# Patient Record
Sex: Female | Born: 1955 | Race: White | Hispanic: No | Smoking: Never smoker
Health system: Southern US, Community
[De-identification: ages and names within clinical notes are randomized; demographics above are authoritative.]

## PROBLEM LIST (undated history)

## (undated) DIAGNOSIS — K3184 Gastroparesis: Secondary | ICD-10-CM

## (undated) DIAGNOSIS — E119 Type 2 diabetes mellitus without complications: Secondary | ICD-10-CM

## (undated) DIAGNOSIS — F411 Generalized anxiety disorder: Secondary | ICD-10-CM

## (undated) DIAGNOSIS — Z780 Asymptomatic menopausal state: Secondary | ICD-10-CM

## (undated) DIAGNOSIS — E0590 Thyrotoxicosis, unspecified without thyrotoxic crisis or storm: Principal | ICD-10-CM

## (undated) DIAGNOSIS — IMO0001 Reserved for inherently not codable concepts without codable children: Secondary | ICD-10-CM

## (undated) DIAGNOSIS — E785 Hyperlipidemia, unspecified: Secondary | ICD-10-CM

## (undated) DIAGNOSIS — E1129 Type 2 diabetes mellitus with other diabetic kidney complication: Secondary | ICD-10-CM

## (undated) DIAGNOSIS — M810 Age-related osteoporosis without current pathological fracture: Secondary | ICD-10-CM

## (undated) DIAGNOSIS — J349 Unspecified disorder of nose and nasal sinuses: Secondary | ICD-10-CM

## (undated) DIAGNOSIS — I10 Essential (primary) hypertension: Secondary | ICD-10-CM

## (undated) DIAGNOSIS — K219 Gastro-esophageal reflux disease without esophagitis: Secondary | ICD-10-CM

## (undated) DIAGNOSIS — D510 Vitamin B12 deficiency anemia due to intrinsic factor deficiency: Secondary | ICD-10-CM

## (undated) HISTORY — PX: GROWTH PLATE SURGERY: SHX657

## (undated) HISTORY — DX: Age-related osteoporosis without current pathological fracture: M81.0

## (undated) HISTORY — DX: Generalized anxiety disorder: F41.1

## (undated) HISTORY — DX: Vitamin B12 deficiency anemia due to intrinsic factor deficiency: D51.0

## (undated) HISTORY — PX: TUBAL LIGATION: SHX77

## (undated) HISTORY — DX: Gastro-esophageal reflux disease without esophagitis: K21.9

## (undated) HISTORY — DX: Asymptomatic menopausal state: Z78.0

## (undated) HISTORY — DX: Essential (primary) hypertension: I10

## (undated) HISTORY — DX: Type 2 diabetes mellitus with other diabetic kidney complication: E11.29

## (undated) HISTORY — DX: Reserved for inherently not codable concepts without codable children: IMO0001

## (undated) HISTORY — DX: Unspecified disorder of nose and nasal sinuses: J34.9

## (undated) HISTORY — DX: Type 2 diabetes mellitus without complications: E11.9

## (undated) HISTORY — DX: Gastroparesis: K31.84

## (undated) HISTORY — DX: Thyrotoxicosis, unspecified without thyrotoxic crisis or storm: E05.90

## (undated) HISTORY — PX: CHOLECYSTECTOMY: SHX55

## (undated) HISTORY — DX: Hyperlipidemia, unspecified: E78.5

---

## 1998-06-28 ENCOUNTER — Encounter: Admission: RE | Admit: 1998-06-28 | Discharge: 1998-09-26 | Payer: Self-pay | Admitting: Endocrinology

## 1998-08-24 ENCOUNTER — Encounter: Payer: Self-pay | Admitting: Endocrinology

## 1998-08-24 ENCOUNTER — Ambulatory Visit (HOSPITAL_COMMUNITY): Admission: RE | Admit: 1998-08-24 | Discharge: 1998-08-24 | Payer: Self-pay | Admitting: Endocrinology

## 2004-01-13 ENCOUNTER — Ambulatory Visit: Payer: Self-pay | Admitting: Endocrinology

## 2004-02-18 ENCOUNTER — Ambulatory Visit: Payer: Self-pay | Admitting: Endocrinology

## 2004-12-09 ENCOUNTER — Ambulatory Visit: Payer: Self-pay | Admitting: Endocrinology

## 2004-12-16 ENCOUNTER — Ambulatory Visit: Payer: Self-pay | Admitting: Endocrinology

## 2005-01-13 ENCOUNTER — Ambulatory Visit: Payer: Self-pay | Admitting: Endocrinology

## 2005-02-13 ENCOUNTER — Ambulatory Visit: Payer: Self-pay | Admitting: Endocrinology

## 2005-03-17 ENCOUNTER — Ambulatory Visit: Payer: Self-pay | Admitting: Endocrinology

## 2005-04-17 ENCOUNTER — Ambulatory Visit: Payer: Self-pay | Admitting: Endocrinology

## 2005-05-15 ENCOUNTER — Ambulatory Visit: Payer: Self-pay | Admitting: Endocrinology

## 2005-06-09 ENCOUNTER — Ambulatory Visit: Payer: Self-pay | Admitting: Gastroenterology

## 2005-06-15 ENCOUNTER — Ambulatory Visit: Payer: Self-pay | Admitting: Endocrinology

## 2005-07-17 ENCOUNTER — Ambulatory Visit: Payer: Self-pay | Admitting: Endocrinology

## 2005-08-17 ENCOUNTER — Ambulatory Visit: Payer: Self-pay | Admitting: Endocrinology

## 2005-09-02 ENCOUNTER — Emergency Department (HOSPITAL_COMMUNITY): Admission: EM | Admit: 2005-09-02 | Discharge: 2005-09-02 | Payer: Self-pay | Admitting: Emergency Medicine

## 2005-09-11 ENCOUNTER — Ambulatory Visit: Payer: Self-pay | Admitting: Endocrinology

## 2005-10-13 ENCOUNTER — Ambulatory Visit: Payer: Self-pay | Admitting: Endocrinology

## 2005-11-10 ENCOUNTER — Ambulatory Visit: Payer: Self-pay | Admitting: Endocrinology

## 2005-12-07 ENCOUNTER — Ambulatory Visit: Payer: Self-pay | Admitting: Endocrinology

## 2005-12-22 ENCOUNTER — Ambulatory Visit: Payer: Self-pay | Admitting: Endocrinology

## 2005-12-22 LAB — CONVERTED CEMR LAB
ALT: 20 units/L (ref 0–40)
AST: 23 units/L (ref 0–37)
Albumin: 3.8 g/dL (ref 3.5–5.2)
Alkaline Phosphatase: 64 units/L (ref 39–117)
BUN: 17 mg/dL (ref 6–23)
Basophils Absolute: 0 10*3/uL (ref 0.0–0.1)
Basophils Relative: 0.5 % (ref 0.0–1.0)
Bilirubin Urine: NEGATIVE
CO2: 26 meq/L (ref 19–32)
Calcium: 9.5 mg/dL (ref 8.4–10.5)
Chloride: 101 meq/L (ref 96–112)
Chol/HDL Ratio, serum: 2.9
Cholesterol: 123 mg/dL (ref 0–200)
Creatinine, Ser: 0.9 mg/dL (ref 0.4–1.2)
Creatinine,U: 157.1 mg/dL
Crystals: NEGATIVE
Eosinophil percent: 2 % (ref 0.0–5.0)
GFR calc non Af Amer: 70 mL/min
Glomerular Filtration Rate, Af Am: 85 mL/min/{1.73_m2}
Glucose, Bld: 171 mg/dL — ABNORMAL HIGH (ref 70–99)
HCT: 38.1 % (ref 36.0–46.0)
HDL: 42.2 mg/dL (ref >39.0)
Hemoglobin, Urine: NEGATIVE
Hemoglobin: 12.7 g/dL (ref 12.0–15.0)
Hgb A1c MFr Bld: 6.5 % — ABNORMAL HIGH (ref 4.6–6.0)
Ketones, ur: NEGATIVE mg/dL
LDL Cholesterol: 65 mg/dL (ref 0–99)
Lymphocytes Relative: 28 % (ref 12.0–46.0)
MCHC: 33.2 g/dL (ref 30.0–36.0)
MCV: 88.2 fL (ref 78.0–100.0)
Microalb Creat Ratio: 2.5 mg/g (ref 0.0–30.0)
Microalb, Ur: 0.4 mg/dL (ref 0.0–1.9)
Monocytes Absolute: 0.5 10*3/uL (ref 0.2–0.7)
Monocytes Relative: 6.1 % (ref 3.0–11.0)
Mucus, UA: NEGATIVE
Neutro Abs: 5.5 10*3/uL (ref 1.4–7.7)
Neutrophils Relative %: 63.4 % (ref 43.0–77.0)
Nitrite: NEGATIVE
Platelets: 405 10*3/uL — ABNORMAL HIGH (ref 150–400)
Potassium: 4.6 meq/L (ref 3.5–5.1)
RBC / HPF: NONE SEEN
RBC: 4.32 M/uL (ref 3.87–5.11)
RDW: 12.9 % (ref 11.5–14.6)
Sodium: 136 meq/L (ref 135–145)
Specific Gravity, Urine: 1.02 (ref 1.000–1.03)
TSH: 0.78 microintl units/mL (ref 0.35–5.50)
Total Bilirubin: 0.7 mg/dL (ref 0.3–1.2)
Total Protein, Urine: NEGATIVE mg/dL
Total Protein: 7 g/dL (ref 6.0–8.3)
Triglyceride fasting, serum: 79 mg/dL (ref 0–149)
Urine Glucose: NEGATIVE mg/dL
Urobilinogen, UA: 1 (ref 0.0–1.0)
VLDL: 16 mg/dL (ref 0–40)
WBC: 8.6 10*3/uL (ref 4.5–10.5)
pH: 6 (ref 5.0–8.0)

## 2006-01-12 ENCOUNTER — Ambulatory Visit: Payer: Self-pay | Admitting: Endocrinology

## 2006-01-12 ENCOUNTER — Ambulatory Visit: Payer: Self-pay | Admitting: Internal Medicine

## 2006-02-09 ENCOUNTER — Ambulatory Visit: Payer: Self-pay | Admitting: Endocrinology

## 2006-03-09 ENCOUNTER — Ambulatory Visit: Payer: Self-pay | Admitting: Endocrinology

## 2006-04-06 ENCOUNTER — Ambulatory Visit: Payer: Self-pay | Admitting: Endocrinology

## 2006-04-27 ENCOUNTER — Ambulatory Visit: Payer: Self-pay | Admitting: Endocrinology

## 2006-04-27 LAB — CONVERTED CEMR LAB: Hgb A1c MFr Bld: 6.9 % — ABNORMAL HIGH (ref 4.6–6.0)

## 2006-05-25 ENCOUNTER — Ambulatory Visit: Payer: Self-pay | Admitting: Endocrinology

## 2006-06-22 ENCOUNTER — Ambulatory Visit: Payer: Self-pay | Admitting: Endocrinology

## 2006-07-13 ENCOUNTER — Ambulatory Visit: Payer: Self-pay | Admitting: Endocrinology

## 2006-07-30 ENCOUNTER — Encounter: Payer: Self-pay | Admitting: Endocrinology

## 2006-07-30 DIAGNOSIS — E11359 Type 2 diabetes mellitus with proliferative diabetic retinopathy without macular edema: Secondary | ICD-10-CM

## 2006-07-30 DIAGNOSIS — E1149 Type 2 diabetes mellitus with other diabetic neurological complication: Secondary | ICD-10-CM | POA: Insufficient documentation

## 2006-07-30 DIAGNOSIS — M810 Age-related osteoporosis without current pathological fracture: Secondary | ICD-10-CM | POA: Insufficient documentation

## 2006-07-30 DIAGNOSIS — D510 Vitamin B12 deficiency anemia due to intrinsic factor deficiency: Secondary | ICD-10-CM

## 2006-07-30 DIAGNOSIS — K219 Gastro-esophageal reflux disease without esophagitis: Secondary | ICD-10-CM

## 2006-07-30 DIAGNOSIS — E113599 Type 2 diabetes mellitus with proliferative diabetic retinopathy without macular edema, unspecified eye: Secondary | ICD-10-CM | POA: Insufficient documentation

## 2006-07-30 HISTORY — DX: Age-related osteoporosis without current pathological fracture: M81.0

## 2006-07-30 HISTORY — DX: Vitamin B12 deficiency anemia due to intrinsic factor deficiency: D51.0

## 2006-07-30 HISTORY — DX: Gastro-esophageal reflux disease without esophagitis: K21.9

## 2006-08-10 ENCOUNTER — Ambulatory Visit: Payer: Self-pay | Admitting: Endocrinology

## 2006-09-07 ENCOUNTER — Ambulatory Visit: Payer: Self-pay | Admitting: Endocrinology

## 2006-09-07 LAB — CONVERTED CEMR LAB: Hgb A1c MFr Bld: 7 % — ABNORMAL HIGH (ref 4.6–6.0)

## 2006-10-05 ENCOUNTER — Ambulatory Visit: Payer: Self-pay | Admitting: Endocrinology

## 2007-01-08 ENCOUNTER — Ambulatory Visit: Payer: Self-pay | Admitting: Endocrinology

## 2007-01-08 DIAGNOSIS — G5600 Carpal tunnel syndrome, unspecified upper limb: Secondary | ICD-10-CM

## 2007-01-08 DIAGNOSIS — IMO0001 Reserved for inherently not codable concepts without codable children: Secondary | ICD-10-CM

## 2007-01-08 HISTORY — DX: Reserved for inherently not codable concepts without codable children: IMO0001

## 2007-02-25 ENCOUNTER — Encounter: Payer: Self-pay | Admitting: Endocrinology

## 2007-04-03 ENCOUNTER — Ambulatory Visit: Payer: Self-pay | Admitting: Endocrinology

## 2007-04-03 DIAGNOSIS — M79609 Pain in unspecified limb: Secondary | ICD-10-CM

## 2007-04-10 ENCOUNTER — Encounter: Payer: Self-pay | Admitting: Endocrinology

## 2007-04-10 ENCOUNTER — Ambulatory Visit: Payer: Self-pay

## 2007-04-18 ENCOUNTER — Encounter: Payer: Self-pay | Admitting: Endocrinology

## 2007-07-05 ENCOUNTER — Ambulatory Visit: Payer: Self-pay | Admitting: Endocrinology

## 2007-07-05 DIAGNOSIS — K319 Disease of stomach and duodenum, unspecified: Secondary | ICD-10-CM

## 2007-07-05 DIAGNOSIS — E1129 Type 2 diabetes mellitus with other diabetic kidney complication: Secondary | ICD-10-CM

## 2007-07-05 DIAGNOSIS — E119 Type 2 diabetes mellitus without complications: Secondary | ICD-10-CM

## 2007-07-05 DIAGNOSIS — Z780 Asymptomatic menopausal state: Secondary | ICD-10-CM | POA: Insufficient documentation

## 2007-07-05 DIAGNOSIS — R609 Edema, unspecified: Secondary | ICD-10-CM

## 2007-07-05 HISTORY — DX: Asymptomatic menopausal state: Z78.0

## 2007-07-05 HISTORY — DX: Type 2 diabetes mellitus without complications: E11.9

## 2007-07-05 HISTORY — DX: Type 2 diabetes mellitus with other diabetic kidney complication: E11.29

## 2007-07-05 LAB — CONVERTED CEMR LAB: Hgb A1c MFr Bld: 7.4 % — ABNORMAL HIGH (ref 4.6–6.0)

## 2007-08-01 ENCOUNTER — Telehealth: Payer: Self-pay | Admitting: Gastroenterology

## 2007-08-09 ENCOUNTER — Ambulatory Visit: Payer: Self-pay | Admitting: Endocrinology

## 2007-08-16 DIAGNOSIS — K5730 Diverticulosis of large intestine without perforation or abscess without bleeding: Secondary | ICD-10-CM | POA: Insufficient documentation

## 2007-08-20 ENCOUNTER — Ambulatory Visit: Payer: Self-pay | Admitting: Gastroenterology

## 2007-08-20 DIAGNOSIS — K3184 Gastroparesis: Secondary | ICD-10-CM | POA: Insufficient documentation

## 2007-08-20 HISTORY — DX: Gastroparesis: K31.84

## 2007-09-12 ENCOUNTER — Ambulatory Visit: Payer: Self-pay | Admitting: Endocrinology

## 2007-09-12 DIAGNOSIS — R238 Other skin changes: Secondary | ICD-10-CM

## 2007-09-13 ENCOUNTER — Telehealth (INDEPENDENT_AMBULATORY_CARE_PROVIDER_SITE_OTHER): Payer: Self-pay | Admitting: *Deleted

## 2007-09-13 LAB — CONVERTED CEMR LAB
Basophils Absolute: 0 10*3/uL (ref 0.0–0.1)
Basophils Relative: 0.2 % (ref 0.0–3.0)
Eosinophils Relative: 4.1 % (ref 0.0–5.0)
Lymphocytes Relative: 27.2 % (ref 12.0–46.0)
Neutrophils Relative %: 61.4 % (ref 43.0–77.0)
Prothrombin Time: 12.4 s (ref 10.9–13.3)
RBC: 4.64 M/uL (ref 3.87–5.11)
WBC: 11.7 10*3/uL — ABNORMAL HIGH (ref 4.5–10.5)

## 2007-09-19 ENCOUNTER — Encounter: Payer: Self-pay | Admitting: Endocrinology

## 2007-09-20 ENCOUNTER — Encounter: Payer: Self-pay | Admitting: Endocrinology

## 2007-11-18 ENCOUNTER — Telehealth: Payer: Self-pay | Admitting: Endocrinology

## 2007-11-18 ENCOUNTER — Ambulatory Visit: Payer: Self-pay | Admitting: Endocrinology

## 2007-11-18 DIAGNOSIS — I10 Essential (primary) hypertension: Secondary | ICD-10-CM | POA: Insufficient documentation

## 2007-11-18 HISTORY — DX: Essential (primary) hypertension: I10

## 2007-11-19 ENCOUNTER — Telehealth (INDEPENDENT_AMBULATORY_CARE_PROVIDER_SITE_OTHER): Payer: Self-pay | Admitting: *Deleted

## 2008-02-06 ENCOUNTER — Telehealth: Payer: Self-pay | Admitting: Endocrinology

## 2008-02-28 HISTORY — PX: TRIGGER FINGER RELEASE: SHX641

## 2008-02-28 HISTORY — PX: RETINAL DETACHMENT SURGERY: SHX105

## 2008-02-28 HISTORY — PX: OTHER SURGICAL HISTORY: SHX169

## 2008-02-28 HISTORY — PX: CARPAL TUNNEL RELEASE: SHX101

## 2008-03-13 ENCOUNTER — Telehealth: Payer: Self-pay | Admitting: Endocrinology

## 2008-03-19 ENCOUNTER — Ambulatory Visit: Payer: Self-pay | Admitting: Endocrinology

## 2008-03-20 ENCOUNTER — Telehealth: Payer: Self-pay | Admitting: Endocrinology

## 2008-04-22 ENCOUNTER — Encounter: Payer: Self-pay | Admitting: Endocrinology

## 2008-04-30 ENCOUNTER — Ambulatory Visit: Payer: Self-pay | Admitting: Endocrinology

## 2008-05-11 ENCOUNTER — Telehealth: Payer: Self-pay | Admitting: Endocrinology

## 2008-05-12 ENCOUNTER — Ambulatory Visit: Payer: Self-pay | Admitting: Endocrinology

## 2008-05-12 DIAGNOSIS — E7800 Pure hypercholesterolemia, unspecified: Secondary | ICD-10-CM

## 2008-05-13 ENCOUNTER — Telehealth (INDEPENDENT_AMBULATORY_CARE_PROVIDER_SITE_OTHER): Payer: Self-pay | Admitting: *Deleted

## 2008-05-13 ENCOUNTER — Encounter: Payer: Self-pay | Admitting: Endocrinology

## 2008-05-14 ENCOUNTER — Ambulatory Visit (HOSPITAL_COMMUNITY): Admission: RE | Admit: 2008-05-14 | Discharge: 2008-05-15 | Payer: Self-pay | Admitting: Ophthalmology

## 2008-06-23 ENCOUNTER — Telehealth: Payer: Self-pay | Admitting: Endocrinology

## 2008-07-13 ENCOUNTER — Ambulatory Visit: Payer: Self-pay | Admitting: Endocrinology

## 2008-07-15 LAB — CONVERTED CEMR LAB
Albumin: 4.1 g/dL (ref 3.5–5.2)
Alkaline Phosphatase: 79 units/L (ref 39–117)
Basophils Absolute: 0 10*3/uL (ref 0.0–0.1)
Chloride: 104 meq/L (ref 96–112)
Cholesterol: 130 mg/dL (ref 0–200)
Creatinine, Ser: 0.6 mg/dL (ref 0.4–1.2)
Eosinophils Absolute: 0.4 10*3/uL (ref 0.0–0.7)
Hemoglobin, Urine: NEGATIVE
Hemoglobin: 13.6 g/dL (ref 12.0–15.0)
Hgb A1c MFr Bld: 7.1 % — ABNORMAL HIGH (ref 4.6–6.5)
LDL Cholesterol: 66 mg/dL (ref 0–99)
Lymphocytes Relative: 39.9 % (ref 12.0–46.0)
Lymphs Abs: 3.3 10*3/uL (ref 0.7–4.0)
MCHC: 33.9 g/dL (ref 30.0–36.0)
Neutro Abs: 3.9 10*3/uL (ref 1.4–7.7)
Nitrite: NEGATIVE
Potassium: 4.8 meq/L (ref 3.5–5.1)
RDW: 13 % (ref 11.5–14.6)
Sodium: 141 meq/L (ref 135–145)
TSH: 0.73 microintl units/mL (ref 0.35–5.50)
Triglycerides: 76 mg/dL (ref 0.0–149.0)
Urobilinogen, UA: 1 (ref 0.0–1.0)

## 2008-07-20 ENCOUNTER — Ambulatory Visit: Payer: Self-pay | Admitting: Endocrinology

## 2008-08-20 ENCOUNTER — Telehealth: Payer: Self-pay | Admitting: Endocrinology

## 2008-11-10 ENCOUNTER — Ambulatory Visit: Payer: Self-pay | Admitting: Endocrinology

## 2009-01-19 ENCOUNTER — Telehealth: Payer: Self-pay | Admitting: Endocrinology

## 2009-01-26 ENCOUNTER — Telehealth: Payer: Self-pay | Admitting: Endocrinology

## 2009-02-09 ENCOUNTER — Ambulatory Visit: Payer: Self-pay | Admitting: Endocrinology

## 2009-04-26 ENCOUNTER — Telehealth: Payer: Self-pay | Admitting: Endocrinology

## 2009-05-06 ENCOUNTER — Ambulatory Visit: Payer: Self-pay | Admitting: Endocrinology

## 2009-07-01 ENCOUNTER — Telehealth: Payer: Self-pay | Admitting: Endocrinology

## 2009-08-17 ENCOUNTER — Telehealth: Payer: Self-pay | Admitting: Endocrinology

## 2009-08-17 ENCOUNTER — Ambulatory Visit: Payer: Self-pay | Admitting: Endocrinology

## 2009-08-17 LAB — CONVERTED CEMR LAB: Hgb A1c MFr Bld: 7.5 % — ABNORMAL HIGH (ref 4.6–6.5)

## 2009-08-25 ENCOUNTER — Telehealth: Payer: Self-pay | Admitting: Endocrinology

## 2009-08-31 ENCOUNTER — Telehealth: Payer: Self-pay | Admitting: Endocrinology

## 2009-09-08 ENCOUNTER — Telehealth: Payer: Self-pay | Admitting: Endocrinology

## 2009-09-16 ENCOUNTER — Telehealth: Payer: Self-pay | Admitting: Endocrinology

## 2009-11-10 ENCOUNTER — Ambulatory Visit: Payer: Self-pay | Admitting: Endocrinology

## 2009-11-10 LAB — CONVERTED CEMR LAB
ALT: 30 units/L (ref 0–35)
AST: 27 units/L (ref 0–37)
Alkaline Phosphatase: 74 units/L (ref 39–117)
BUN: 16 mg/dL (ref 6–23)
Bilirubin Urine: NEGATIVE
Bilirubin, Direct: 0.1 mg/dL (ref 0.0–0.3)
Calcium: 10.1 mg/dL (ref 8.4–10.5)
Eosinophils Relative: 5.4 % — ABNORMAL HIGH (ref 0.0–5.0)
GFR calc non Af Amer: 104.1 mL/min (ref >60)
HCT: 38.8 % (ref 36.0–46.0)
Hemoglobin, Urine: NEGATIVE
Hgb A1c MFr Bld: 7.8 % — ABNORMAL HIGH (ref 4.6–6.5)
LDL Cholesterol: 65 mg/dL (ref 0–99)
Lymphocytes Relative: 38.3 % (ref 12.0–46.0)
Lymphs Abs: 3.1 10*3/uL (ref 0.7–4.0)
Microalb, Ur: 0.8 mg/dL (ref 0.0–1.9)
Monocytes Relative: 7 % (ref 3.0–12.0)
Neutrophils Relative %: 48.7 % (ref 43.0–77.0)
Platelets: 344 10*3/uL (ref 150.0–400.0)
Potassium: 5.4 meq/L — ABNORMAL HIGH (ref 3.5–5.1)
Total Bilirubin: 0.4 mg/dL (ref 0.3–1.2)
Total CHOL/HDL Ratio: 3
Total Protein, Urine: NEGATIVE mg/dL
Urine Glucose: NEGATIVE mg/dL
VLDL: 10.4 mg/dL (ref 0.0–40.0)
WBC: 8 10*3/uL (ref 4.5–10.5)
pH: 6.5 (ref 5.0–8.0)

## 2009-11-11 ENCOUNTER — Telehealth: Payer: Self-pay | Admitting: Endocrinology

## 2009-11-16 ENCOUNTER — Encounter: Payer: Self-pay | Admitting: Endocrinology

## 2009-11-16 ENCOUNTER — Ambulatory Visit: Payer: Self-pay | Admitting: Endocrinology

## 2009-11-16 DIAGNOSIS — E0590 Thyrotoxicosis, unspecified without thyrotoxic crisis or storm: Secondary | ICD-10-CM | POA: Insufficient documentation

## 2009-11-16 HISTORY — DX: Thyrotoxicosis, unspecified without thyrotoxic crisis or storm: E05.90

## 2009-11-29 ENCOUNTER — Encounter (HOSPITAL_COMMUNITY)
Admission: RE | Admit: 2009-11-29 | Discharge: 2010-02-27 | Payer: Self-pay | Source: Home / Self Care | Attending: Endocrinology | Admitting: Endocrinology

## 2009-12-02 ENCOUNTER — Ambulatory Visit: Payer: Self-pay | Admitting: Endocrinology

## 2009-12-08 ENCOUNTER — Telehealth: Payer: Self-pay | Admitting: Endocrinology

## 2009-12-08 LAB — CONVERTED CEMR LAB
Free T4: 0.87 ng/dL (ref 0.60–1.60)
TSH: 0.03 microintl units/mL — ABNORMAL LOW (ref 0.35–5.50)

## 2009-12-24 ENCOUNTER — Ambulatory Visit: Payer: Self-pay | Admitting: Endocrinology

## 2009-12-24 LAB — CONVERTED CEMR LAB: TSH: 0.03 microintl units/mL — ABNORMAL LOW (ref 0.35–5.50)

## 2009-12-28 ENCOUNTER — Telehealth: Payer: Self-pay | Admitting: Endocrinology

## 2010-01-04 ENCOUNTER — Telehealth: Payer: Self-pay | Admitting: Endocrinology

## 2010-01-14 ENCOUNTER — Ambulatory Visit (HOSPITAL_COMMUNITY): Admission: RE | Admit: 2010-01-14 | Discharge: 2010-01-14 | Payer: Self-pay | Admitting: Endocrinology

## 2010-02-04 ENCOUNTER — Telehealth: Payer: Self-pay | Admitting: Endocrinology

## 2010-02-08 ENCOUNTER — Ambulatory Visit: Payer: Self-pay | Admitting: Endocrinology

## 2010-02-08 DIAGNOSIS — F411 Generalized anxiety disorder: Secondary | ICD-10-CM | POA: Insufficient documentation

## 2010-02-08 HISTORY — DX: Generalized anxiety disorder: F41.1

## 2010-02-08 LAB — CONVERTED CEMR LAB
Free T4: 0.95 ng/dL (ref 0.60–1.60)
Hgb A1c MFr Bld: 7.8 % — ABNORMAL HIGH (ref 4.6–6.5)

## 2010-03-10 ENCOUNTER — Ambulatory Visit
Admission: RE | Admit: 2010-03-10 | Discharge: 2010-03-10 | Payer: Self-pay | Source: Home / Self Care | Attending: Endocrinology | Admitting: Endocrinology

## 2010-03-10 ENCOUNTER — Other Ambulatory Visit: Payer: Self-pay | Admitting: Endocrinology

## 2010-03-10 LAB — T4, FREE: Free T4: 0.91 ng/dL (ref 0.60–1.60)

## 2010-03-10 LAB — TSH: TSH: 0.02 u[IU]/mL — ABNORMAL LOW (ref 0.35–5.50)

## 2010-03-27 LAB — CONVERTED CEMR LAB
ALT: 22 units/L (ref 0–35)
ALT: 24 units/L (ref 0–35)
AST: 28 units/L (ref 0–37)
AST: 30 units/L (ref 0–37)
Albumin: 4.1 g/dL (ref 3.5–5.2)
Alkaline Phosphatase: 82 units/L (ref 39–117)
Basophils Absolute: 0.1 10*3/uL (ref 0.0–0.1)
Basophils Relative: 0.8 % (ref 0.0–3.0)
Bilirubin Urine: NEGATIVE
Bilirubin, Direct: 0.3 mg/dL (ref 0.0–0.3)
CO2: 25 meq/L (ref 19–32)
CO2: 31 meq/L (ref 19–32)
Calcium: 10.2 mg/dL (ref 8.4–10.5)
Chloride: 102 meq/L (ref 96–112)
Chloride: 96 meq/L (ref 96–112)
Cholesterol: 141 mg/dL (ref 0–200)
Creatinine,U: 44.1 mg/dL
Eosinophils Absolute: 0.3 10*3/uL (ref 0.0–0.7)
Eosinophils Absolute: 0.4 10*3/uL (ref 0.0–0.6)
Eosinophils Relative: 3.9 % (ref 0.0–5.0)
GFR calc Af Amer: 97 mL/min
Glucose, Bld: 126 mg/dL — ABNORMAL HIGH (ref 70–99)
HCT: 37.2 % (ref 36.0–46.0)
Hemoglobin, Urine: NEGATIVE
Hemoglobin, Urine: NEGATIVE
Hemoglobin: 12.9 g/dL (ref 12.0–15.0)
Ketones, ur: NEGATIVE mg/dL
Ketones, ur: NEGATIVE mg/dL
LDL Cholesterol: 73 mg/dL (ref 0–99)
Lymphocytes Relative: 32.9 % (ref 12.0–46.0)
Lymphocytes Relative: 35.3 % (ref 12.0–46.0)
MCHC: 33.8 g/dL (ref 30.0–36.0)
MCV: 86.1 fL (ref 78.0–100.0)
Microalb Creat Ratio: 4.5 mg/g (ref 0.0–30.0)
Monocytes Absolute: 0.6 10*3/uL (ref 0.2–0.7)
Monocytes Relative: 7.6 % (ref 3.0–12.0)
Mucus, UA: NEGATIVE
Neutro Abs: 5.3 10*3/uL (ref 1.4–7.7)
Neutrophils Relative %: 54.4 % (ref 43.0–77.0)
Neutrophils Relative %: 55.6 % (ref 43.0–77.0)
Potassium: 4.3 meq/L (ref 3.5–5.1)
RBC: 4.32 M/uL (ref 3.87–5.11)
RBC: 4.99 M/uL (ref 3.87–5.11)
RDW: 13.3 % (ref 11.5–14.6)
TSH: 1.38 microintl units/mL (ref 0.35–5.50)
Total Bilirubin: 0.6 mg/dL (ref 0.3–1.2)
Total Bilirubin: 0.9 mg/dL (ref 0.3–1.2)
Total Protein, Urine: NEGATIVE mg/dL
Total Protein, Urine: NEGATIVE mg/dL
Total Protein: 7 g/dL (ref 6.0–8.3)
Total Protein: 7.5 g/dL (ref 6.0–8.3)
Triglycerides: 76 mg/dL (ref 0.0–149.0)
Urine Glucose: NEGATIVE mg/dL
VLDL: 15.2 mg/dL (ref 0.0–40.0)
WBC: 9.7 10*3/uL (ref 4.5–10.5)

## 2010-03-31 NOTE — Progress Notes (Signed)
Summary: Rx refill req  Phone Note Refill Request Message from:  Fax from Pharmacy on January 04, 2010 4:36 PM  Refills Requested: Medication #1:  AMITIZA 24 MCG CAPS 1 bid   Dosage confirmed as above?Dosage Confirmed   Last Refilled: 11/22/2009  Method Requested: Electronic Next Appointment Scheduled: 02/08/2010 Initial call taken by: Brenton Grills CMA Duncan Dull),  January 04, 2010 4:37 PM    Prescriptions: AMITIZA 24 MCG CAPS (LUBIPROSTONE) 1 bid  #60 x 3   Entered by:   Brenton Grills CMA (AAMA)   Authorized by:   Minus Breeding MD   Signed by:   Brenton Grills CMA (AAMA) on 01/04/2010   Method used:   Electronically to        [facility] (retail)       [address]       [place], [place]  [address]       Ph: [phone]       Fax: [phone]   RxID:   [id number]

## 2010-03-31 NOTE — Assessment & Plan Note (Signed)
Summary: physical/#/cd   Vital Signs:  Patient profile:   55 year old female Height:      64 inches (162.56 cm) Weight:      185 pounds (84.09 kg) BMI:     31.87 O2 Sat:      98 % on Room air Temp:     98.2 degrees F (36.78 degrees C) oral Pulse rate:   70 / minute BP sitting:   108 / 70  (left arm) Cuff size:   regular  Vitals Entered By: Brenton Grills MA (November 16, 2009 8:39 AM)  O2 Flow:  Room air CC: Physical/pt is no longer using Wrists Splints and is no longer taking Azithromycin/aj Is Patient Diabetic? Yes   Primary Provider:  Dr Romero Belling  CC:  Physical/pt is no longer using Wrists Splints and is no longer taking Azithromycin/aj.  History of Present Illness: here for regular wellness examination.  He's feeling pretty well in general, and does not drink or smoke.   Current Medications (verified): 1)  Pamelor 75 Mg  Caps (Nortriptyline Hcl) .... Take 1 By Mouth Qd 2)  Glucophage 1000 Mg  Tabs (Metformin Hcl) .... Take 1 By Mouth Two Times A Day Qd 3)  Humalog 100 Unit/ml  Soln (Insulin Lispro (Human)) .... Tid (Qac) 0-5-35 Units 4)  Calcium 600/vitamin D 600-200 Mg-Unit  Tabs (Calcium Carbonate-Vitamin D) .... Take 2 Tablets By Mouth Once Daily 5)  Vytorin 10-80 Mg  Tabs (Ezetimibe-Simvastatin) .... Take 1 By Mouth Qd 6)  Bilateral Wrist Splints For Carpal Tunnel Syndrome .... Wear As Much As Possible. One Pair 7)  Glucose Test Strips and Lancets, Any Brand .... Two Times A Day 250.00 8)  Aspirin 81 Mg  Tbec (Aspirin) .... Take 1 Tablet By Mouth Once A Day 9)  Humulin N Pen 100 Unit/ml  Susp (Insulin Isophane Human) .Marland Kitchen.. 15 Units Qhs 10)  Cyanocobalamin 1000 Mcg/ml Soln (Cyanocobalamin) .... Inject 1ml Intramuscular Once A Month 11)  Prilosec Otc 20 Mg Tbec (Omeprazole Magnesium) .... 2 Qd 12)  Screening Mammography 13)  Amitiza 24 Mcg Caps (Lubiprostone) .Marland Kitchen.. 1 Bid 14)  Losartan Potassium-Hctz 50-12.5 Mg Tabs (Losartan Potassium-Hctz) .Marland Kitchen.. 1 Once Daily 15)   Azithromycin 500 Mg Tabs (Azithromycin) .Marland Kitchen.. 1 Once Daily  Allergies (verified): No Known Drug Allergies  Family History: Reviewed history from 08/20/2007 and no changes required. no cancer in her immediate family No FH of Colon Cancer Family History of Diabetes: Mother Family History of Heart Disease: Mother, Father  Social History: Reviewed history from 08/20/2007 and no changes required. unemployed.  She is married. Patient has never smoked.  Alcohol Use - no Illicit Drug Use - no Patient does not get regular exercise.   Review of Systems       The patient complains of weight gain.  The patient denies fever, vision loss, decreased hearing, chest pain, syncope, dyspnea on exertion, prolonged cough, headaches, abdominal pain, melena, hematochezia, severe indigestion/heartburn, hematuria, and suspicious skin lesions.         she has depression  Physical Exam  General:  obese.   Head:  head: no deformity eyes: no periorbital swelling, no proptosis external nose and ears are normal mouth: no lesion seen Breasts:  sees gyn  Heart:  Regular rate and rhythm without murmurs or gallops noted. Normal S1,S2.   Abdomen:  abdomen is soft, nontender.  no hepatosplenomegaly.   not distended.  no hernia  Rectal:  sees gyn  Genitalia:  sees gyn  Msk:  muscle bulk and strength are grossly normal.  no obvious joint swelling.  gait is normal and steady  Extremities:  no deformity.  no ulcer on the feet.  feet are of normal color and temp.  no edema there are a few bilat varicosities. Neurologic:  cn 2-12 grossly intact.   readily moves all 4's.   sensation is intact to touch on the feet  Skin:  normal texture and temp.  no rash.  not diaphoretic  Cervical Nodes:  No significant adenopathy.  Psych:  Alert and cooperative; normal mood and affect; normal attention span and concentration.   Additional Exam:  SEPARATE EVALUATION FOLLOWS--EACH PROBLEM HERE IS NEW, NOT RESPONDING TO  TREATMENT, OR POSES SIGNIFICANT RISK TO THE PATIENT'S HEALTH: HISTORY OF THE PRESENT ILLNESS: pt states 1 year of a slight need to move her legs at night, and assoc "tossing and turning."  pt says she sleeps well. no cbg record, but states cbg's are still highest in am (higher than at hs). PAST MEDICAL HISTORY reviewed and up to date today REVIEW OF SYSTEMS: denies hypoglycemia.  she has anxiety PHYSICAL EXAMINATION: dorsalis pedis intact bilat.  no carotid bruit clear to auscultation.  no respiratory distress neck:  i do not appreciate a goiter LAB/XRAY RESULTS: tsh=0.02 Hemoglobin A1C       [H]  7.8 %      IMPRESSION: dm, needs increased rx restless legs, ? due to hyperthyroidism htn, overcontrolled PLAN: see instruction sheet   Impression & Recommendations:  Problem # 1:  ROUTINE GENERAL MEDICAL EXAM@HEALTH  CARE FACL (ICD-V70.0)  Medications Added to Medication List This Visit: 1)  Humulin N Pen 100 Unit/ml Susp (Insulin isophane human) .... 20 units at bedtime 2)  Losartan Potassium-hctz 50-12.5 Mg Tabs (Losartan potassium-hctz) .... 1/2 tab once daily 3)  Buspirone Hcl 5 Mg Tabs (Buspirone hcl) .Marland Kitchen.. 1 tab two times a day  Other Orders: EKG w/ Interpretation (93000) Radiology Referral (Radiology) Est. Patient Level IV (04540) Est. Patient 40-64 years (98119)  Patient Instructions: 1)  reduce losartan-hct to 1/2 pill once daily 2)  increase nph to 20 units at bedtime. 3)  take buspirone 5 mg two times a day.  call if this does not help, so i can increase it. 4)  please see a gynecologist:  [facility], [facility], [facility], [facility] are examples of good groups. 5)  check thyroid "scan" (a special, but easy and painless thype of thyroid x ray).  you will be called with a day and time for an appointment.  please call [phone] to hear your test results. 6)  please consider these measures for your health:  minimize alcohol.  do not use  tobacco products.  have a colonoscopy at least every 10 years from age 4.  keep firearms safely stored.  always use seat belts.  have working smoke alarms in your home.  see an eye doctor and dentist regularly.  never drive under the influence of alcohol or drugs (including prescription drugs).  those with fair skin should take precautions against the sun. 7)  please let me know what your wishes would be, if artificial life support measures should become necessary.  it is critically important to prevent falling down (keep floor areas well-lit, dry, and free of loose objects) 8)  Please schedule a follow-up appointment in 3 months. Prescriptions: BUSPIRONE HCL 5 MG TABS (BUSPIRONE HCL) 1 tab two times a day  #60 x 11   Entered and Authorized by:  Minus Breeding MD   Signed by:   Minus Breeding MD on 11/16/2009   Method used:   Electronically to        [facility] (retail)       [address]       [place], [place]  [address]       Ph: [phone]       Fax: [phone]   RxID:   [id number]

## 2010-03-31 NOTE — Assessment & Plan Note (Signed)
Summary: 3 MO ROV /NWS   Vital Signs:  Patient profile:   55 year old female Height:      64 inches Weight:      180.38 pounds BMI:     31.07 O2 Sat:      97 % on Room air Temp:     98.2 degrees F oral Pulse rate:   75 / minute BP sitting:   100 / 62  (left arm) Cuff size:   regular  Vitals Entered ByZella Ball Ewing (August 17, 2009 8:03 AM)  O2 Flow:  Room air CC: Endocrinology Consult/RE   Primary Provider:  Dr Romero Belling  CC:  Endocrinology Consult/RE.  History of Present Illness: pt states 4 weeks of dry-quality cough, which is not painful in the chest.  no assoc sob. no cbg record, but states cbg's are well-controlled.  it is still highest in am.  Current Medications (verified): 1)  Pamelor 75 Mg  Caps (Nortriptyline Hcl) .... Take 1 By Mouth Qd 2)  Glucophage 1000 Mg  Tabs (Metformin Hcl) .... Take 1 By Mouth Two Times A Day Qd 3)  Humalog 100 Unit/ml  Soln (Insulin Lispro (Human)) .... Tid (Qac) 0-5-35 Units 4)  Calcium 600/vitamin D 600-200 Mg-Unit  Tabs (Calcium Carbonate-Vitamin D) .... Take 2 Tablets By Mouth Once Daily 5)  Vytorin 10-80 Mg  Tabs (Ezetimibe-Simvastatin) .... Take 1 By Mouth Qd 6)  Bilateral Wrist Splints For Carpal Tunnel Syndrome .... Wear As Much As Possible. One Pair 7)  Glucose Test Strips and Lancets, Any Brand .... Two Times A Day 250.00 8)  Aspirin 81 Mg  Tbec (Aspirin) .... Take 1 Tablet By Mouth Once A Day 9)  Humulin N Pen 100 Unit/ml  Susp (Insulin Isophane Human) .Marland Kitchen.. 10 Units Qhs 10)  Zestoretic 10-12.5 Mg Tabs (Lisinopril-Hydrochlorothiazide) .... Qd 11)  Cyanocobalamin 1000 Mcg/ml Soln (Cyanocobalamin) .... Inject 1ml Intramuscular Once A Month 12)  Prilosec Otc 20 Mg Tbec (Omeprazole Magnesium) .... 2 Qd 13)  Screening Mammography 14)  Amitiza 24 Mcg Caps (Lubiprostone) .Marland Kitchen.. 1 Bid  Allergies (verified): No Known Drug Allergies  Review of Systems  The patient denies fever.         she has slight rhinorrhea  Physical  Exam  General:  normal appearance.   Head:  head: no deformity eyes: no periorbital swelling, no proptosis external nose and ears are normal mouth: no lesion seen Ears:  right tm is slightly red.  left is normal Lungs:  Clear to auscultation bilaterally. Normal respiratory effort.  Additional Exam:  Hemoglobin A1C       [H]  7.5 %    Impression & Recommendations:  Problem # 1:  DM (ICD-250.00) Assessment Improved  Problem # 2:  cough possibly due to acei  Problem # 3:  uri vs allergic rhinitis  Medications Added to Medication List This Visit: 1)  Humalog 100 Unit/ml Soln (Insulin lispro (human)) .... Tid (qac) 0-5-35 units 2)  Humulin N Pen 100 Unit/ml Susp (Insulin isophane human) .Marland Kitchen.. 15 units qhs 3)  Losartan Potassium-hctz 50-12.5 Mg Tabs (Losartan potassium-hctz) .Marland Kitchen.. 1 once daily 4)  Azithromycin 500 Mg Tabs (Azithromycin) .Marland Kitchen.. 1 once daily  Other Orders: TLB-A1C / Hgb A1C (Glycohemoglobin) (83036-A1C) Est. Patient Level IV (14431)  Patient Instructions: 1)  azithromycin 500 mg once daily 2)  change lisinopril to losartan-hctz, 1/day 3)  take loratadine-d as needed for congestion. 4)  blood tests are being ordered for you today.  please call [phone] to  hear your test results. 5)  pending the test results, please continue the same insulin for now 6)  Please schedule a physical appointment in 3 months. 7)  (update: i left message on phone-tree:  rx as we discussed) Prescriptions: AZITHROMYCIN 500 MG TABS (AZITHROMYCIN) 1 once daily  #6 x 0   Entered and Authorized by:   Minus Breeding MD   Signed by:   Minus Breeding MD on 08/17/2009   Method used:   Electronically to        [facility] (retail)       [address]       [place], [place]  [address]       Ph: [phone]       Fax: [phone]   RxID:   [id number] LOSARTAN POTASSIUM-HCTZ 50-12.5 MG TABS (LOSARTAN POTASSIUM-HCTZ) 1 once daily  #30 x 11   Entered and  Authorized by:   Minus Breeding MD   Signed by:   Minus Breeding MD on 08/17/2009   Method used:   Electronically to        [facility] (retail)       [address] [place], [place]  [address]       Ph: [phone]       Fax: [phone]   RxID:   [id number]

## 2010-03-31 NOTE — Assessment & Plan Note (Signed)
Summary: 3 MTH FU  STC   Vital Signs:  Patient profile:   55 year old female Height:      64 inches (162.56 cm) Weight:      190.38 pounds (86.54 kg) BMI:     32.80 O2 Sat:      98 % on Room air Temp:     98.5 degrees F (36.94 degrees C) oral Pulse rate:   80 / minute Pulse rhythm:   regular BP sitting:   112 / 72  (left arm) Cuff size:   regular  Vitals Entered By: Brenton Grills CMA Duncan Dull) (February 08, 2010 9:23 AM)  O2 Flow:  Room air CC: 3 month F/U/follow up on thyroid/aj Is Patient Diabetic? Yes Comments Pt is due for mammogram and pap smear   Primary Garyn Arlotta:  Dr Romero Belling  CC:  3 month F/U/follow up on thyroid/aj.  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  pt states she feels well in general, except for slight cold intolerance. dm: no cbg record, but states cbg's was low once, at hs.  this is the lowest time of day.  it is highest in am.  anxiety: she reports difficulty with concentration, but buspar helps.    Current Medications (verified): 1)  Pamelor 75 Mg  Caps (Nortriptyline Hcl) .... Take 1 By Mouth Qd 2)  Glucophage 1000 Mg  Tabs (Metformin Hcl) .... Take 1 By Mouth Two Times A Day Qd 3)  Humalog 100 Unit/ml  Soln (Insulin Lispro (Human)) .... Tid (Qac) 0-5-35 Units 4)  Calcium 600/vitamin D 600-200 Mg-Unit  Tabs (Calcium Carbonate-Vitamin D) .... Take 2 Tablets By Mouth Once Daily 5)  Vytorin 10-80 Mg  Tabs (Ezetimibe-Simvastatin) .... Take 1 By Mouth Qd 6)  Glucose Test Strips and Lancets, Any Brand .... Two Times A Day 250.00 7)  Aspirin 81 Mg  Tbec (Aspirin) .... Take 1 Tablet By Mouth Once A Day 8)  Humulin N Pen 100 Unit/ml  Susp (Insulin Isophane Human) .... 20 Units At Bedtime 9)  Cyanocobalamin 1000 Mcg/ml Soln (Cyanocobalamin) .... Inject 1ml Intramuscular Once A Month 10)  Prilosec Otc 20 Mg Tbec  (Omeprazole Magnesium) .... 2 Qd 11)  Screening Mammography 12)  Amitiza 24 Mcg Caps (Lubiprostone) .Marland Kitchen.. 1 Bid 13)  Losartan Potassium-Hctz 50-12.5 Mg Tabs (Losartan Potassium-Hctz) .... 1/2 Tab Once Daily 14)  Buspirone Hcl 5 Mg Tabs (Buspirone Hcl) .Marland Kitchen.. 1 Tab Two Times A Day 15)  Novofine 30g X 8 Mm Misc (Insulin Pen Needle) .... Use As Directed  Allergies (verified): No Known Drug Allergies  Past History:  Past Medical History: Last updated: 07/30/2006 Diabetes mellitus, type II GERD Osteoporosis Synoviis (R) hip Menopause  Review of Systems  The patient denies weight gain and syncope.    Physical Exam  General:  normal appearance.   Neck:  i do not apprecitate a goiter.   Additional Exam:  FastTSH              [L]  0.03 uIU/mL                 0.35-5.50  Hemoglobin A1C       [H]  7.8 %                       4.6-6.5   Free T4  0.95 ng/dL                  0.60-1.60    Impression & Recommendations:  Problem # 1:  DM (ICD-250.00) she needs some adjustment in her therapy  Problem # 2:  HYPERTHYROIDISM (ICD-242.90) not better yet  Problem # 3:  ANXIETY (ICD-300.00) Assessment: Improved mild  Medications Added to Medication List This Visit: 1)  Humulin N Pen 100 Unit/ml Susp (Insulin isophane human) .... 25 units at bedtime 2)  Humalog Kwikpen 100 Unit/ml Soln (Insulin lispro (human)) .... Three times a day (just before each meal) 0-5-30 units, and pen needles two times a day  Other Orders: TLB-TSH (Thyroid Stimulating Hormone) (84443-TSH) TLB-A1C / Hgb A1C (Glycohemoglobin) (83036-A1C) TLB-T4 (Thyrox), Free [phone]) Est. Patient Level IV (32440)  Patient Instructions: 1)  blood tests are being ordered for you today.  please call [phone] to hear your test results. 2)  pending the test results, please increase nph to 25 units at bedtime, and decrease huamlog to (just before each meal) 0-5-30 units.   3)  Please schedule a follow-up  appointment in 1 month. 4)  (update: i left message on phone-tree:  rx as we discussed) Prescriptions: HUMALOG KWIKPEN 100 UNIT/ML SOLN (INSULIN LISPRO (HUMAN)) three times a day (just before each meal) 0-5-30 units, and pen needles two times a day  #1 box x 11   Entered and Authorized by:   Minus Breeding MD   Signed by:   Minus Breeding MD on 02/08/2010   Method used:   Print then Give to Patient   RxID:   [id number]    Orders Added: 1)  TLB-TSH (Thyroid Stimulating Hormone) [84443-TSH] 2)  TLB-A1C / Hgb A1C (Glycohemoglobin) [83036-A1C] 3)  TLB-T4 (Thyrox), Free [75643-PI9J] 4)  Est. Patient Level IV [18841]   Immunization History:  Influenza Immunization History:    Influenza:  historical (10/28/2009)   Immunization History:  Influenza Immunization History:    Influenza:  Historical (10/28/2009)

## 2010-03-31 NOTE — Progress Notes (Signed)
  Phone Note Refill Request Message from:  Fax from Pharmacy on August 17, 2009 2:42 PM  Refills Requested: Medication #1:  PRILOSEC OTC 20 MG TBEC 2 qd   Dosage confirmed as above?Dosage Confirmed   Last Refilled: 04/26/2009   Notes: [facility] Initial call taken by: Zella Ball Ewing CMA Duncan Dull),  August 17, 2009 2:44 PM    Prescriptions: PRILOSEC OTC 20 MG TBEC (OMEPRAZOLE MAGNESIUM) 2 qd  #60 x 11   Entered by:   Zella Ball Ewing CMA (AAMA)   Authorized by:   Minus Breeding MD   Signed by:   Scharlene Gloss CMA (AAMA) on 08/17/2009   Method used:   Faxed to ...       [facility] (retail)       [address]       [place], [place]  [address]       Ph: [phone]       Fax: [phone]   RxID:   [id number]

## 2010-03-31 NOTE — Progress Notes (Signed)
  Phone Note Refill Request Message from:  Fax from Pharmacy on April 26, 2009 3:05 PM  Refills Requested: Medication #1:  VYTORIN 10-80 MG  TABS take 1 by mouth qd   Dosage confirmed as above?Dosage Confirmed  Medication #2:  PRILOSEC OTC 20 MG TBEC 2 qd   Dosage confirmed as above?Dosage Confirmed Initial call taken by: Josph Macho RMA,  April 26, 2009 3:06 PM    Prescriptions: PRILOSEC OTC 20 MG TBEC (OMEPRAZOLE MAGNESIUM) 2 qd  #60 x 2   Entered by:   Josph Macho RMA   Authorized by:   Minus Breeding MD   Signed by:   Josph Macho RMA on 04/26/2009   Method used:   Electronically to        [facility] (retail)       [address] [place], [place]  [address]       Ph: [phone]       Fax: [phone]   RxID:   [id number] VYTORIN 10-80 MG  TABS (EZETIMIBE-SIMVASTATIN) take 1 by mouth qd  #30 x 2   Entered by:   Josph Macho RMA   Authorized by:   Minus Breeding MD   Signed by:   Josph Macho RMA on 04/26/2009   Method used:   Electronically to        [facility] (retail)       [address]       [place], [place]  [address]       Ph: [phone]       Fax: [phone]   RxID:   [id number]

## 2010-03-31 NOTE — Progress Notes (Signed)
  Phone Note Refill Request Message from:  Fax from Pharmacy on Jul 01, 2009 10:35 AM  Refills Requested: Medication #1:  ZESTORETIC 10-12.5 MG TABS qd   Dosage confirmed as above?Dosage Confirmed Initial call taken by: Josph Macho RMA,  Jul 01, 2009 10:35 AM    Prescriptions: ZESTORETIC 10-12.5 MG TABS (LISINOPRIL-HYDROCHLOROTHIAZIDE) qd  #90 Each x 1   Entered by:   Josph Macho RMA   Authorized by:   Minus Breeding MD   Signed by:   Josph Macho RMA on 07/01/2009   Method used:   Electronically to        [facility] (retail)       [address]       [place], [place]  [address]       Ph: [phone]       Fax: [phone]   RxID:   [id number]

## 2010-03-31 NOTE — Progress Notes (Signed)
Summary: rx refill req  Phone Note Refill Request Message from:  Fax from Pharmacy on November 11, 2009 11:51 AM  Refills Requested: Medication #1:  CYANOCOBALAMIN 1000 MCG/ML SOLN inject 1ml intramuscular once a month   Dosage confirmed as above?Dosage Confirmed please advise   Method Requested: Fax to Local Pharmacy Initial call taken by: Brenton Grills MA,  November 11, 2009 11:52 AM  Follow-up for Phone Call        sent Follow-up by: Minus Breeding MD,  November 11, 2009 12:10 PM    Prescriptions: CYANOCOBALAMIN 1000 MCG/ML SOLN (CYANOCOBALAMIN) inject 1ml intramuscular once a month  #77ml x 6   Entered and Authorized by:   Minus Breeding MD   Signed by:   Minus Breeding MD on 11/11/2009   Method used:   Electronically to        [facility] (retail)       [address]       [place], [place]  [address]       Ph: [phone]       Fax: [phone]   RxID:   [id number]

## 2010-03-31 NOTE — Progress Notes (Signed)
Summary: vytorin  Phone Note Refill Request Message from:  Fax from Pharmacy on December 08, 2009 10:16 AM  Refills Requested: Medication #1:  VYTORIN 10-80 MG  TABS take 1 by mouth qd   Dosage confirmed as above?Dosage Confirmed   Last Refilled: 11/08/2009  Method Requested: Electronic Initial call taken by: Brenton Grills MA,  December 08, 2009 10:16 AM    Prescriptions: VYTORIN 10-80 MG  TABS (EZETIMIBE-SIMVASTATIN) take 1 by mouth qd  #30 x 3   Entered by:   Brenton Grills MA   Authorized by:   Minus Breeding MD   Signed by:   Brenton Grills MA on 12/08/2009   Method used:   Electronically to        [facility] (retail)       [address]       [place], [place]  [address]       Ph: [phone]       Fax: [phone]   RxID:   [id number]

## 2010-03-31 NOTE — Assessment & Plan Note (Signed)
Summary: 3 MO FU/PN   Vital Signs:  Patient profile:   55 year old female Height:      65 inches (165.10 cm) Weight:      177.50 pounds (80.68 kg) O2 Sat:      97 % on Room air Temp:     97.6 degrees F (36.44 degrees C) oral Pulse rate:   98 / minute BP sitting:   130 / 64  (right arm) Cuff size:   regular  Vitals Entered ByDebby Freiberg Christopher(May 06, 2009 3:36 PM)  O2 Flow:  Room air CC: 3 month F/U   Primary Provider:  Dr Romero Belling  CC:  3 month F/U.  History of Present Illness: pt states she feels well in general.  no cbg record, but states cbg's vary from 170-200.  it is highest at hs, and in am.   Current Medications (verified): 1)  Pamelor 75 Mg  Caps (Nortriptyline Hcl) .... Take 1 By Mouth Qd 2)  Glucophage 1000 Mg  Tabs (Metformin Hcl) .... Take 1 By Mouth Two Times A Day Qd 3)  Humalog 100 Unit/ml  Soln (Insulin Lispro (Human)) .... Tid (Qac) 0-5-30 Units 4)  Calcium 600/vitamin D 600-200 Mg-Unit  Tabs (Calcium Carbonate-Vitamin D) .... Take 2 Tablets By Mouth Once Daily 5)  Vytorin 10-80 Mg  Tabs (Ezetimibe-Simvastatin) .... Take 1 By Mouth Qd 6)  Bilateral Wrist Splints For Carpal Tunnel Syndrome .... Wear As Much As Possible. One Pair 7)  Glucose Test Strips and Lancets, Any Brand .... Two Times A Day 250.00 8)  Aspirin 81 Mg  Tbec (Aspirin) .... Take 1 Tablet By Mouth Once A Day 9)  Humulin N Pen 100 Unit/ml  Susp (Insulin Isophane Human) .Marland Kitchen.. 10 Units Qhs 10)  Zestoretic 10-12.5 Mg Tabs (Lisinopril-Hydrochlorothiazide) .... Qd 11)  Cyanocobalamin 1000 Mcg/ml Soln (Cyanocobalamin) .... Inject 1ml Intramuscular Once A Month 12)  Prilosec Otc 20 Mg Tbec (Omeprazole Magnesium) .... 2 Qd 13)  Screening Mammography 14)  Amitiza 24 Mcg Caps (Lubiprostone) .Marland Kitchen.. 1 Bid  Allergies (verified): No Known Drug Allergies  Past History:  Past Medical History: Last updated: 07/30/2006 Diabetes mellitus, type II GERD Osteoporosis Synoviis (R)  hip Menopause  Review of Systems  The patient denies hypoglycemia.    Physical Exam  General:  obese.  no distress. Neck:  Supple without thyroid enlargement or tenderness.  Additional Exam:  Hemoglobin A1C       [H]  7.5 %    Impression & Recommendations:  Problem # 1:  DM (ICD-250.00) needs increased rx  Other Orders: TLB-A1C / Hgb A1C (Glycohemoglobin) (83036-A1C) Est. Patient Level III (04540)  Patient Instructions: 1)  pending the test results: 2)  continue nph at 10 units at night. 3)  continue humalog (just before each meal) 0-5-30. 4)  tests are being ordered for you today.  a few days after the test(s), please call [phone] to hear your test results.  this is very important to do because the results may change the instructions you see here. 5)  return for a physical in 3 months. 6)  (update: i left message on phone-tree:  increase supper humalog to 35 units).

## 2010-03-31 NOTE — Progress Notes (Signed)
Summary: I-131  Phone Note Call from Patient   Caller: Patient [phone] Summary of Call: Pt called stating that she has already had the Radioactive Iodine done (12/02/2009). Pt says she was advised to have this done per last lab result on PT, please advise. Initial call taken by: Margaret Pyle, CMA,  December 28, 2009 9:38 AM  Follow-up for Phone Call        the 11/29/09 nuclear med was the test phase, but now we would be doing the treatment.  would do thetreatment if i was you. Follow-up by: Minus Breeding MD,  December 28, 2009 10:52 AM  Additional Follow-up for Phone Call Additional follow up Details #1::        Pt advised and states that she wants to speak with MD about treatment before deciding if it is what she wants to do. Additional Follow-up by: Margaret Pyle, CMA,  December 28, 2009 11:22 AM    Additional Follow-up for Phone Call Additional follow up Details #2::    i called pt 12/28/09.  we discussed.  at pt's request, i ordered i-131 rx.  ret 1-2 mos later. Follow-up by: Minus Breeding MD,  December 28, 2009 11:48 AM

## 2010-03-31 NOTE — Progress Notes (Signed)
Summary: vytorin  Phone Note Refill Request Message from:  Fax from Pharmacy  Refills Requested: Medication #1:  VYTORIN 10-80 MG  TABS take 1 by mouth qd   Dosage confirmed as above?Dosage Confirmed   Last Refilled: 04/26/2009  Method Requested: Fax to Local Pharmacy Initial call taken by: Brenton Grills MA,  September 08, 2009 11:58 AM    Prescriptions: VYTORIN 10-80 MG  TABS (EZETIMIBE-SIMVASTATIN) take 1 by mouth qd  #30 x 2   Entered by:   Brenton Grills MA   Authorized by:   Minus Breeding MD   Signed by:   Brenton Grills MA on 09/08/2009   Method used:   Faxed to ...       [facility] (retail)       [address]       [place], [place]  [address]       Ph: [phone]       Fax: [phone]   RxID:   [id number]

## 2010-03-31 NOTE — Progress Notes (Signed)
Summary: test strips  Phone Note Refill Request Message from:  Fax from Pharmacy on February 04, 2010 2:42 PM  Refills Requested: Medication #1:  GLUCOSE TEST STRIPS AND LANCETS   Dosage confirmed as above?Dosage Confirmed   Last Refilled: 12/13/2009  Method Requested: Electronic Next Appointment Scheduled: 02/08/2010 Initial call taken by: Brenton Grills CMA Duncan Dull),  February 04, 2010 2:43 PM    Prescriptions: GLUCOSE TEST STRIPS AND LANCETS, ANY BRAND two times a day 250.00  #100 x 6   Entered by:   Brenton Grills CMA (AAMA)   Authorized by:   Minus Breeding MD   Signed by:   Brenton Grills CMA (AAMA) on 02/04/2010   Method used:   Faxed to ...       [facility] (retail)       [address]       [place], [place]  [address]       Ph: [phone]       Fax: [phone]   RxID:   [id number]

## 2010-03-31 NOTE — Assessment & Plan Note (Signed)
Summary: 1 MTH FU  STC   Vital Signs:  Patient profile:   55 year old female Height:      64 inches (162.56 cm) Weight:      193.13 pounds (87.79 kg) BMI:     33.27 O2 Sat:      96 % on Room air Temp:     98.9 degrees F (37.17 degrees C) oral Pulse rate:   80 / minute Pulse rhythm:   regular BP sitting:   120 / 78  (left arm) Cuff size:   regular  Vitals Entered By: Brenton Grills CMA Duncan Dull) (March 10, 2010 9:56 AM)  O2 Flow:  Room air CC: 1 month F/U/aj Is Patient Diabetic? Yes Comments Pt is due for mammogram and pap   Primary Provider:  Dr Romero Belling  CC:  1 month F/U/aj.  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  she reports weight gain. dm: no cbg record, but states cbg's are highest at hs (sometimes over 300).. she has a "hot feeling," when this happens.   anxiety: she reports persistent mood swings.  she wonders if this is due to hyperthyroidism, or if it is a separate problem.    Current Medications (verified): 1)  Pamelor 75 Mg  Caps (Nortriptyline Hcl) .... Take 1 By Mouth Qd 2)  Glucophage 1000 Mg  Tabs (Metformin Hcl) .... Take 1 By Mouth Two Times A Day Qd 3)  Calcium 600/vitamin D 600-200 Mg-Unit  Tabs (Calcium Carbonate-Vitamin D) .... Take 2 Tablets By Mouth Once Daily 4)  Vytorin 10-80 Mg  Tabs (Ezetimibe-Simvastatin) .... Take 1 By Mouth Qd 5)  Glucose Test Strips and Lancets, Any Brand .... Two Times A Day 250.00 6)  Aspirin 81 Mg  Tbec (Aspirin) .... Take 1 Tablet By Mouth Once A Day 7)  Humulin N Pen 100 Unit/ml  Susp (Insulin Isophane Human) .... 25 Units At Bedtime 8)  Cyanocobalamin 1000 Mcg/ml Soln (Cyanocobalamin) .... Inject 1ml Intramuscular Once A Month 9)  Prilosec Otc 20 Mg Tbec (Omeprazole Magnesium) .... 2 Qd 10)  Screening Mammography 11)  Amitiza 24 Mcg Caps (Lubiprostone) .Marland Kitchen.. 1 Bid 12)   Losartan Potassium-Hctz 50-12.5 Mg Tabs (Losartan Potassium-Hctz) .... 1/2 Tab Once Daily 13)  Buspirone Hcl 5 Mg Tabs (Buspirone Hcl) .Marland Kitchen.. 1 Tab Two Times A Day 14)  Novofine 30g X 8 Mm Misc (Insulin Pen Needle) .... Use As Directed 15)  Humalog Kwikpen 100 Unit/ml Soln (Insulin Lispro (Human)) .... Three Times A Day (Just Before Each Meal) 0-5-30 Units, and Pen Needles Two Times A Day  Allergies (verified): No Known Drug Allergies  Past History:  Past Medical History: Last updated: 07/30/2006 Diabetes mellitus, type II GERD Osteoporosis Synoviis (R) hip Menopause  Social History: unemployed.  She is married Patient has never smoked.  Alcohol Use - no Illicit Drug Use - no Patient does not get regular exercise.   Review of Systems  The patient denies hypoglycemia and depression.    Physical Exam  Neck:  i do not apprecitate a goiter.   Additional Exam:  FastTSH              [L]  0.02 uIU/mL                 0.35-5.50 Free T4  0.91 ng/dL                  0.60-1.60    Impression & Recommendations:  Problem # 1:  DM (ICD-250.00) she needs some adjustment in her therapy  Problem # 2:  HYPERTHYROIDISM (ICD-242.90) not better yet  Problem # 3:  ANXIETY (ICD-300.00) needs increased rx  Medications Added to Medication List This Visit: 1)  Humalog Kwikpen 100 Unit/ml Soln (Insulin lispro (human)) .... Three times a day (just before each meal) 0-5-35 units, and pen needles two times a day 2)  Buspirone Hcl 10 Mg Tabs (Buspirone hcl) .Marland Kitchen.. 1 tab two times a day  Other Orders: Admin of Therapeutic Inj  intramuscular or subcutaneous (56213) Vit B12 1000 mcg (J3420) TLB-TSH (Thyroid Stimulating Hormone) (84443-TSH) TLB-T4 (Thyrox), Free (08657-QI6N) Est. Patient Level IV (62952)  Patient Instructions: 1)  blood tests are being ordered for you today.  please call [phone] to hear your test results. 2)  pending the test results, please increase nph to 25  units at bedtime, and decrease huamlog to (just before each meal) 0-5-35 units.   3)  increase buspirone to 10 mg two times a day 4)  Please schedule a follow-up appointment in 1 month. 5)  (update: i left message on phone-tree:  rx as we discussed) Prescriptions: BUSPIRONE HCL 10 MG TABS (BUSPIRONE HCL) 1 tab two times a day  #60 x 11   Entered and Authorized by:   Minus Breeding MD   Signed by:   Minus Breeding MD on 03/10/2010   Method used:   Electronically to        [facility] (retail)       [address]       [place], [place]  [address]       Ph: [phone]       Fax: [phone]   RxID:   [id number] HUMALOG KWIKPEN 100 UNIT/ML SOLN (INSULIN LISPRO (HUMAN)) three times a day (just before each meal) 0-5-35 units, and pen needles two times a day  #1 box x 11   Entered and Authorized by:   Minus Breeding MD   Signed by:   Minus Breeding MD on 03/10/2010   Method used:   Electronically to        [facility] (retail)       [address]       [place], [place]  [address]       Ph: [phone]       Fax: [phone]   RxID:   [id number] HUMALOG KWIKPEN 100 UNIT/ML SOLN (INSULIN LISPRO (HUMAN)) three times a day (just before each meal) 0-5-35 units, and pen needles two times a day  #1 box x 11   Entered and Authorized by:   Minus Breeding MD   Signed by:   Minus Breeding MD on 03/10/2010   Method used:   Print then Give to Patient   RxID:   [id number]    Medication Administration  Injection # 1:    Medication: Vit B12 1000 mcg    Diagnosis: PERNICIOUS ANEMIA (ICD-281.0)    Route: IM    Site: R deltoid    Exp Date: 03/2011    Lot #: [id number]    Mfr: [employer]    Patient tolerated injection without complications    Given by: Brenton Grills CMA Duncan Dull) (March 10, 2010 10:28 AM)  Orders Added: 1)  Admin of Therapeutic Inj  intramuscular or subcutaneous [96372] 2)  Vit B12 1000  mcg [J3420] 3)  TLB-TSH (Thyroid Stimulating Hormone) [84443-TSH] 4)  TLB-T4 (Thyrox), Free [84696-EX5M] 5)  Est. Patient Level IV [84132]

## 2010-03-31 NOTE — Progress Notes (Signed)
Summary: amitiza  Phone Note Refill Request Message from:  Fax from Pharmacy  Refills Requested: Medication #1:  AMITIZA 24 MCG CAPS 1 bid   Dosage confirmed as above?Dosage Confirmed Next Appointment Scheduled: 11/17/2009 Initial call taken by: Brenton Grills MA,  September 16, 2009 2:55 PM    Prescriptions: AMITIZA 24 MCG CAPS (LUBIPROSTONE) 1 bid  #60 x 2   Entered by:   Brenton Grills MA   Authorized by:   Minus Breeding MD   Signed by:   Brenton Grills MA on 09/16/2009   Method used:   Faxed to ...       [facility] (retail)       [address]       [place], [place]  [address]       Ph: [phone]       Fax: [phone]   RxID:   [id number]

## 2010-03-31 NOTE — Progress Notes (Signed)
  Phone Note Refill Request Message from:  Fax from Pharmacy  Refills Requested: Medication #1:  GLUCOSE TEST STRIPS AND LANCETS Initial call taken by: Brenton Grills MA,  August 31, 2009 11:56 AM    Prescriptions: GLUCOSE TEST STRIPS AND LANCETS, ANY BRAND two times a day 250.00  #100 x 3   Entered by:   Brenton Grills MA   Authorized by:   Minus Breeding MD   Signed by:   Brenton Grills MA on 08/31/2009   Method used:   Faxed to ...       [facility] (retail)       [address]       [place], [place]  [address]       Ph: [phone]       Fax: [phone]   RxID:   [id number]

## 2010-03-31 NOTE — Progress Notes (Signed)
Summary: metformin  Phone Note Refill Request Message from:  Fax from Pharmacy  Refills Requested: Medication #1:  GLUCOPHAGE 1000 MG  TABS take 1 by mouth two times a day qd   Dosage confirmed as above?Dosage Confirmed Initial call taken by: Brenton Grills MA,  August 25, 2009 1:06 PM    Prescriptions: GLUCOPHAGE 1000 MG  TABS (METFORMIN HCL) take 1 by mouth two times a day qd  #180 Each x 1   Entered by:   Brenton Grills MA   Authorized by:   Minus Breeding MD   Signed by:   Brenton Grills MA on 08/25/2009   Method used:   Faxed to ...       [facility] (retail)       [address]       [place], [place]  [address]       Ph: [phone]       Fax: [phone]   RxID:   [id number]

## 2010-04-14 ENCOUNTER — Ambulatory Visit (INDEPENDENT_AMBULATORY_CARE_PROVIDER_SITE_OTHER): Payer: BC Managed Care – PPO | Admitting: Endocrinology

## 2010-04-14 ENCOUNTER — Encounter: Payer: Self-pay | Admitting: Endocrinology

## 2010-04-14 ENCOUNTER — Other Ambulatory Visit: Payer: Self-pay | Admitting: Endocrinology

## 2010-04-14 ENCOUNTER — Encounter (INDEPENDENT_AMBULATORY_CARE_PROVIDER_SITE_OTHER): Payer: Self-pay | Admitting: *Deleted

## 2010-04-14 ENCOUNTER — Other Ambulatory Visit: Payer: BC Managed Care – PPO

## 2010-04-14 DIAGNOSIS — E0590 Thyrotoxicosis, unspecified without thyrotoxic crisis or storm: Secondary | ICD-10-CM

## 2010-04-14 DIAGNOSIS — E119 Type 2 diabetes mellitus without complications: Secondary | ICD-10-CM

## 2010-04-14 LAB — T4, FREE: Free T4: 0.71 ng/dL (ref 0.60–1.60)

## 2010-04-26 NOTE — Assessment & Plan Note (Signed)
Summary: 1 MTH FU STC   Vital Signs:  Patient profile:   55 year old female Height:      64 inches (162.56 cm) Weight:      194.06 pounds (88.21 kg) BMI:     33.43 O2 Sat:      96 % on Room air Temp:     98.4 degrees F (36.89 degrees C) oral Pulse rate:   92 / minute BP sitting:   118 / 84  (left arm) Cuff size:   regular  Vitals Entered By: Brenton Grills CMA Duncan Dull) (April 14, 2010 9:56 AM)  O2 Flow:  Room air CC: 1 month F/U/aj Is Patient Diabetic? Yes   Primary Provider:  Dr Romero Belling  CC:  1 month F/U/aj.  History of Present Illness: the status of at least 3 ongoing medical problems is addressed today: hyperthyroidism: pt is 3 months s/p i-131 rx for hyperthyroidism due to grave's dz.  she reports weight gain.   dm: no cbg record, but states cbg's are highest in am (200's).  she feels this is due to hs-snack. it is sometimes low before lunch.  she eats a small breakfast.   anxiety and depression:  they persist.  she feels this is due to her parents' illnesses.  Current Medications (verified): 1)  Pamelor 75 Mg  Caps (Nortriptyline Hcl) .... Take 1 By Mouth Qd 2)  Glucophage 1000 Mg  Tabs (Metformin Hcl) .... Take 1 By Mouth Two Times A Day Qd 3)  Calcium 600/vitamin D 600-200 Mg-Unit  Tabs (Calcium Carbonate-Vitamin D) .... Take 2 Tablets By Mouth Once Daily 4)  Vytorin 10-80 Mg  Tabs (Ezetimibe-Simvastatin) .... Take 1 By Mouth Qd 5)  Glucose Test Strips and Lancets, Any Brand .... Two Times A Day 250.00 6)  Aspirin 81 Mg  Tbec (Aspirin) .... Take 1 Tablet By Mouth Once A Day 7)  Humulin N Pen 100 Unit/ml  Susp (Insulin Isophane Human) .... 25 Units At Bedtime 8)  Cyanocobalamin 1000 Mcg/ml Soln (Cyanocobalamin) .... Inject 1ml Intramuscular Once A Month 9)  Prilosec Otc 20 Mg Tbec (Omeprazole Magnesium) .... 2 Qd 10)  Screening Mammography 11)  Amitiza 24 Mcg Caps (Lubiprostone) .Marland Kitchen.. 1 Bid 12)  Losartan Potassium-Hctz 50-12.5 Mg Tabs (Losartan Potassium-Hctz)  .... 1/2 Tab Once Daily 13)  Novofine 30g X 8 Mm Misc (Insulin Pen Needle) .... Use As Directed 14)  Humalog Kwikpen 100 Unit/ml Soln (Insulin Lispro (Human)) .... Three Times A Day (Just Before Each Meal) 0-5-35 Units, and Pen Needles Two Times A Day 15)  Buspirone Hcl 10 Mg Tabs (Buspirone Hcl) .Marland Kitchen.. 1 Tab Two Times A Day  Allergies (verified): No Known Drug Allergies  Past History:  Past Medical History: Last updated: 07/30/2006 Diabetes mellitus, type II GERD Osteoporosis Synoviis (R) hip Menopause  Physical Exam  Neck:  i do not apprecitate a goiter.   Extremities:  no edema Additional Exam:  FastTSH              [L]  0.07 uIU/mL                 0.35-5.50 Free T4                   0.71 ng/dL      Impression & Recommendations:  Problem # 1:  DM (ICD-250.00) she needs some adjustment in her therapy  Problem # 2:  HYPERTHYROIDISM (ICD-242.90) only slightly improved so far  Problem # 3:  ANXIETY (ICD-300.00)  with a situational component, but also due to #2  Medications Added to Medication List This Visit: 1)  Humalog Kwikpen 100 Unit/ml Soln (Insulin lispro (human)) .... Three times a day (just before each meal) 0-5-40 units, and pen needles two times a day 2)  Buspirone Hcl 15 Mg Tabs (Buspirone hcl) .Marland Kitchen.. 1 tab two times a day  Other Orders: TLB-TSH (Thyroid Stimulating Hormone) (84443-TSH) TLB-T4 (Thyrox), Free [phone]) Est. Patient Level IV (40981)  Patient Instructions: 1)  blood tests are being ordered for you today.  please call [phone] to hear your test results. 2)  pending the test results, please increase nph to 25 units at bedtime, and decrease humalog to (just before each meal) 0-5-40 units.   3)  increase buspirone to 15 mg two times a day. 4)  Please schedule a follow-up appointment in 1 month. 5)  (update: i left message on phone-tree:  rx as we discussed) Prescriptions: BUSPIRONE HCL 15 MG TABS (BUSPIRONE HCL) 1 tab two times a day  #60 x 11    Entered and Authorized by:   Minus Breeding MD   Signed by:   Minus Breeding MD on 04/14/2010   Method used:   Electronically to        [facility] (retail)       [address]       [place], [place]  [address]       Ph: [phone]       Fax: [phone]   RxID:   [id number]    Orders Added: 1)  TLB-TSH (Thyroid Stimulating Hormone) [84443-TSH] 2)  TLB-T4 (Thyrox), Free [47425-ZD6L] 3)  Est. Patient Level IV [87564]

## 2010-05-19 ENCOUNTER — Other Ambulatory Visit (INDEPENDENT_AMBULATORY_CARE_PROVIDER_SITE_OTHER): Payer: Self-pay

## 2010-05-19 ENCOUNTER — Encounter: Payer: Self-pay | Admitting: Endocrinology

## 2010-05-19 ENCOUNTER — Ambulatory Visit: Payer: BC Managed Care – PPO | Admitting: Endocrinology

## 2010-05-19 ENCOUNTER — Other Ambulatory Visit: Payer: Self-pay

## 2010-05-19 ENCOUNTER — Ambulatory Visit (INDEPENDENT_AMBULATORY_CARE_PROVIDER_SITE_OTHER): Payer: Self-pay | Admitting: Endocrinology

## 2010-05-19 DIAGNOSIS — E0590 Thyrotoxicosis, unspecified without thyrotoxic crisis or storm: Secondary | ICD-10-CM

## 2010-05-19 DIAGNOSIS — E119 Type 2 diabetes mellitus without complications: Secondary | ICD-10-CM

## 2010-05-19 DIAGNOSIS — E890 Postprocedural hypothyroidism: Secondary | ICD-10-CM

## 2010-05-19 LAB — T4, FREE: Free T4: 0.65 ng/dL (ref 0.60–1.60)

## 2010-05-19 LAB — TSH: TSH: 0.62 u[IU]/mL (ref 0.35–5.50)

## 2010-05-19 MED ORDER — LEVOTHYROXINE SODIUM 75 MCG PO TABS: 75.0000 ug | ORAL_TABLET | Freq: Every day | ORAL | Status: DC

## 2010-05-19 NOTE — Progress Notes (Signed)
  Subjective:    Patient ID: Alyssa Fitzpatrick, female    DOB: Apr 10, 1955, 55 y.o.   MRN: [id number]  HPI the status of at least 3 ongoing medical problems is addressed today: hyperthyroidism: pt is 4 months s/p i-131 rx for hyperthyroidism due to grave's dz.  pt states she feels well in general.  she brings a record of her cbg's which i have reviewed today.  It varies from 130-200, with no trend throughout the day.  She does not check cbg at hs.   anxiety and depression:  they persist.  She has lost her insurance.   amitiza helps constipation.   Past Medical History  Diagnosis Date  . HYPERTHYROIDISM 11/16/2009  . DM 07/05/2007  . NEPHROPATHY, DIABETIC 07/05/2007  . Pernicious anemia 07/30/2006  . HYPERTENSION 11/18/2007  . GERD 07/30/2006  . OSTEOPOROSIS 07/30/2006  . Gastroparesis 08/20/2007  . ANXIETY 02/08/2010  . MYOFASCIAL PAIN SYNDROME 01/08/2007  . ASYMPTOMATIC POSTMENOPAUSAL STATUS 07/05/2007   Past Surgical History  Procedure Date  . Tubal ligation   . Cholecystectomy   . Retinal detachment surgery 2010  . Trigger finger release 2010  . Carpal tunnel release 2010    Bilaterally  . Nerve shealth release 2010    both elbows  . Growth plate surgery     left leg    reports that she has never smoked. She does not have any smokeless tobacco history on file. She reports that she does not drink alcohol or use illicit drugs. family history includes Diabetes in her mother and Heart disease in her father and mother.  There is no history of Cancer. Allergies not on file  Review of Systems Denies hypoglycemia and weight loss.    Objective:   Physical Exam Gen:  no distress Neck:  No palpable goiter.   Ext:  No edema     Lab Results  Component Value Date   TSH 0.62 05/19/2010   Lab Results  Component Value Date   HGBA1C 8.8* 05/19/2010     Assessment & Plan:  Dm, needs increased rx Hypothyroidism, after i-131 rx.  Well-replaced Depression, uncertain etiology Constipation,  well-controlled

## 2010-05-19 NOTE — Patient Instructions (Addendum)
blood tests are being ordered for you today.  please call [phone] to hear your test results. Please return here in 6 weeks.   Try "miralax," (cheaper than "amitiza"). Increase humalog to 3x a day (just before each meal) 0-5-50 units (same nph insulin). check your blood sugar 1 time a day.  vary the time of day when you check, between before the 3 meals, and at bedtime.  also check if you have symptoms of your blood sugar being too high or too low.  please keep a record of the readings and bring it to your next appointment here.  please call us sooner if you are having low blood sugar episodes. (update: i left message on phone-tree:  rx as we discussed, except also start synthroid 75/d).

## 2010-05-20 ENCOUNTER — Telehealth: Payer: Self-pay | Admitting: Endocrinology

## 2010-05-20 ENCOUNTER — Encounter: Payer: Self-pay | Admitting: Endocrinology

## 2010-05-20 NOTE — Telephone Encounter (Signed)
Pt left message stating that she needs a letter for insurance purposes listing her current diagnosis.

## 2010-05-20 NOTE — Telephone Encounter (Signed)
Pt informed via VM that letter is ready for pickup. Letter placed upfront in cabinet.

## 2010-05-20 NOTE — Telephone Encounter (Signed)
i printed letter 

## 2010-06-09 LAB — GLUCOSE, CAPILLARY
Glucose-Capillary: 122 mg/dL — ABNORMAL HIGH (ref 70–99)
Glucose-Capillary: 162 mg/dL — ABNORMAL HIGH (ref 70–99)
Glucose-Capillary: 215 mg/dL — ABNORMAL HIGH (ref 70–99)
Glucose-Capillary: 217 mg/dL — ABNORMAL HIGH (ref 70–99)
Glucose-Capillary: 224 mg/dL — ABNORMAL HIGH (ref 70–99)

## 2010-06-30 ENCOUNTER — Ambulatory Visit: Payer: Self-pay | Admitting: Endocrinology

## 2010-07-08 ENCOUNTER — Encounter: Payer: Self-pay | Admitting: Endocrinology

## 2010-07-08 ENCOUNTER — Other Ambulatory Visit (INDEPENDENT_AMBULATORY_CARE_PROVIDER_SITE_OTHER): Payer: PRIVATE HEALTH INSURANCE

## 2010-07-08 ENCOUNTER — Ambulatory Visit (INDEPENDENT_AMBULATORY_CARE_PROVIDER_SITE_OTHER): Payer: PRIVATE HEALTH INSURANCE | Admitting: Endocrinology

## 2010-07-08 VITALS — BP 118/72 | HR 85 | Temp 98.7°F | Ht 65.0 in | Wt 197.8 lb

## 2010-07-08 DIAGNOSIS — E890 Postprocedural hypothyroidism: Secondary | ICD-10-CM

## 2010-07-08 NOTE — Progress Notes (Signed)
Subjective:    Patient ID: Alyssa Fitzpatrick, female    DOB: 1956/01/22, 55 y.o.   MRN: [id number]  HPI Pt is now 6 mos s/p i-131 rx for hyperthyroidism, due to grave's dz.  She takes synthroid 75/d.  pt states she feels well in general. no cbg record, but states cbg's vary from 150-230 (it is highest in am, and lowest before lunch). Past Medical History  Diagnosis Date  . HYPERTHYROIDISM 11/16/2009  . DM 07/05/2007  . NEPHROPATHY, DIABETIC 07/05/2007  . Pernicious anemia 07/30/2006  . HYPERTENSION 11/18/2007  . GERD 07/30/2006  . OSTEOPOROSIS 07/30/2006  . Gastroparesis 08/20/2007  . ANXIETY 02/08/2010  . MYOFASCIAL PAIN SYNDROME 01/08/2007  . ASYMPTOMATIC POSTMENOPAUSAL STATUS 07/05/2007    Past Surgical History  Procedure Date  . Tubal ligation   . Cholecystectomy   . Retinal detachment surgery 2010  . Trigger finger release 2010  . Carpal tunnel release 2010    Bilaterally  . Nerve shealth release 2010    both elbows  . Growth plate surgery     left leg    History   Social History  . Marital Status: Married    Spouse Name: N/A    Number of Children: N/A  . Years of Education: N/A   Occupational History  .      unemployed   Social History Main Topics  . Smoking status: Never Smoker   . Smokeless tobacco: Not on file  . Alcohol Use: No  . Drug Use: No  . Sexually Active:    Other Topics Concern  . Not on file   Social History Narrative   Pt does not get regular exercise    Current Outpatient Prescriptions on File Prior to Visit  Medication Sig Dispense Refill  . aspirin 81 MG tablet Take 81 mg by mouth daily.        . busPIRone (BUSPAR) 15 MG tablet 1 tablet by mouth two times a day       . Calcium Carbonate-Vitamin D (CALCIUM 600+D) 600-200 MG-UNIT TABS Take 2 tablets by mouth daily.        . cyanocobalamin (,VITAMIN B-12,) 1000 MCG/ML injection Inject 1,000 mcg into the muscle. monthly       . ezetimibe-simvastatin (VYTORIN) 10-80 MG per tablet Take 1 tablet by  mouth daily.        Marland Kitchen glucose blood test strip Two times a day dx 250.00       . insulin lispro (HUMALOG KWIKPEN) 100 UNIT/ML injection Inject into the skin 3 (three) times daily before meals. (just before each meal) 0-5-50 units      . insulin NPH (HUMULIN N PEN) 100 UNIT/ML injection Inject 35 Units into the skin at bedtime.       . Insulin Pen Needle (NOVOFINE) 30G X 8 MM MISC Use as directed 3xs daily dx 250.00       . levothyroxine (SYNTHROID) 75 MCG tablet Take 1 tablet (75 mcg total) by mouth daily.  30 tablet  2  . losartan-hydrochlorothiazide (HYZAAR) 50-12.5 MG per tablet 1/2 tablet by mouth once daily       . lubiprostone (AMITIZA) 24 MCG capsule Take 24 mcg by mouth 2 (two) times daily with a meal.        . metFORMIN (GLUCOPHAGE) 1000 MG tablet Take 1,000 mg by mouth 2 (two) times daily with a meal.        . nortriptyline (PAMELOR) 75 MG capsule 1 capsule by mouth once daily       .  omeprazole (PRILOSEC OTC) 20 MG tablet 2 tablets by mouth once daily         No Known Allergies  Family History  Problem Relation Age of Onset  . Diabetes Mother   . Heart disease Mother   . Heart disease Father   . Cancer Neg Hx     BP 118/72  Pulse 85  Temp(Src) 98.7 F (37.1 C) (Oral)  Ht 5\' 5"  (1.651 m)  Wt 197 lb 12.8 oz (89.721 kg)  BMI 32.92 kg/m2  SpO2 95%    Review of Systems denies hypoglycemia    Objective:   Physical Exam GENERAL: no distress Pulses: dorsalis pedis intact bilat.   Feet: no deformity.  no ulcer on the feet.  feet are of normal color and temp.  no edema Neuro: sensation is intact to touch on the feet     Lab Results  Component Value Date   HGBA1C 8.8* 05/19/2010   Lab Results  Component Value Date   TSH 0.72 07/08/2010     Assessment & Plan:  Dm, needs increased rx post-i-131hypothyroidism, well-replaced

## 2010-07-08 NOTE — Patient Instructions (Addendum)
blood tests are being ordered for you today.  please call [phone] to hear your test results. Please return here in 6 weeks.   increase nph insulin to 35 units at night. check your blood sugar 1 time a day.  vary the time of day when you check, between before the 3 meals, and at bedtime.  also check if you have symptoms of your blood sugar being too high or too low.  please keep a record of the readings and bring it to your next appointment here.  please call us sooner if you are having low blood sugar episodes.

## 2010-07-12 NOTE — Op Note (Signed)
NAMEMarland Kitchen  GRIFFIN, DEWILDE NO.:  [id number]   MEDICAL RECORD NO.:  [id number]          PATIENT TYPE:  OIB   LOCATION:  5121                         FACILITY:  [facility]   PHYSICIAN:  Beulah Gandy. Ashley Royalty, M.D. DATE OF BIRTH:  30-Jun-1955   DATE OF PROCEDURE:  05/14/2008  DATE OF DISCHARGE:                               OPERATIVE REPORT   ADMISSION DIAGNOSES:  1. Rhegmatogenous retinal detachment, right eye.  2. Pseudophakia, right eye.   PROCEDURE:  Scleral buckle, right eye and retinal photocoagulation,  right eye.   SURGEON:  Beulah Gandy. Ashley Royalty, MD   ASSISTANT:  Rosalie Doctor, MA   ANESTHESIA:  General.   DETAILS:  Usual prep and drape, 360-degree limbal peritomy, isolation of  4 rectus muscles on 2-0 silk.  Scleral dissection for 360 degrees to  admit #279 intrascleral implant, 1 mm was trimmed from the implant from  6 o'clock to 12 o'clock.  A 508G radial segment was placed beneath the  break at 4 o'clock.  The 279 implant was placed with a 240 band and a  270 sleeve placed at 2 o'clock.  Two sutures per quadrant for a total of  8 sutures were placed in the scleral flaps.  Perforation site chosen at  4 o'clock on the posterior aspect of the bed.  A large amount of clear  colorless subretinal fluid came forth in a controlled manner.  A 508G  radial segment was placed beneath the break at 4 o'clock.  The scleral  sutures were pulled securely, knotted and the free ends removed.  Indirect ophthalmoscopy showed the retina to be lying nicely in place on  the scleral buckle.  The indirect ophthalmoscope laser was moved into  place, 856 burns were placed around the retinal periphery.  The power  was 500 milliwatts, 1000 microns each and 0.1 seconds each.  The buckle  was adjusted and trimmed.  The band was adjusted and trimmed.  The  sutures were knotted and the free ends removed.  The conjunctiva was  reposited with 7-0 chromic suture.  Polymyxin and gentamicin were  irrigated into the [place] space.  Atropine solution was applied.  Decadron 10 mg was injected into the lower subconjunctival space.  Marcaine was injected around the globe for postop pain.  Closing  pressure was 15 with a [place] tonometer.  Polysporin ophthalmic  ointment, patch and shield were placed.  The patient awakened and taken  to recovery in satisfactory condition.   COMPLICATIONS:  None.   DURATION:  2 hours.      Beulah Gandy. Ashley Royalty, M.D.  Electronically Signed    JDM/MEDQ  D:  05/14/2008  T:  05/15/2008  Job:  [id number]

## 2010-08-07 ENCOUNTER — Other Ambulatory Visit: Payer: Self-pay | Admitting: Endocrinology

## 2010-08-14 ENCOUNTER — Other Ambulatory Visit: Payer: Self-pay | Admitting: Endocrinology

## 2010-08-23 ENCOUNTER — Other Ambulatory Visit (INDEPENDENT_AMBULATORY_CARE_PROVIDER_SITE_OTHER): Payer: PRIVATE HEALTH INSURANCE

## 2010-08-23 ENCOUNTER — Encounter: Payer: Self-pay | Admitting: Endocrinology

## 2010-08-23 ENCOUNTER — Ambulatory Visit (INDEPENDENT_AMBULATORY_CARE_PROVIDER_SITE_OTHER): Payer: PRIVATE HEALTH INSURANCE | Admitting: Endocrinology

## 2010-08-23 DIAGNOSIS — E890 Postprocedural hypothyroidism: Secondary | ICD-10-CM

## 2010-08-23 DIAGNOSIS — E119 Type 2 diabetes mellitus without complications: Secondary | ICD-10-CM

## 2010-08-23 DIAGNOSIS — M79609 Pain in unspecified limb: Secondary | ICD-10-CM

## 2010-08-23 NOTE — Progress Notes (Signed)
Subjective:    Patient ID: Alyssa Fitzpatrick, female    DOB: Feb 06, 1956, 55 y.o.   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  She takes synthroid 75 mcg/d, as rx'ed.  pt states she feels well in general, except for depression, due to her mother's illness.   she brings a record of her cbg's which i have reviewed today.  It is highest at lunch (300), and lower at other times. Pt states of pain at the hands, right >> left, and assoc "locking up" of the ip joint of the left thumb.  She has "dry spots" on the head and legs.  Past Medical History  Diagnosis Date  . HYPERTHYROIDISM 11/16/2009  . DM 07/05/2007  . NEPHROPATHY, DIABETIC 07/05/2007  . Pernicious anemia 07/30/2006  . HYPERTENSION 11/18/2007  . GERD 07/30/2006  . OSTEOPOROSIS 07/30/2006  . Gastroparesis 08/20/2007  . ANXIETY 02/08/2010  . MYOFASCIAL PAIN SYNDROME 01/08/2007  . ASYMPTOMATIC POSTMENOPAUSAL STATUS 07/05/2007    Past Surgical History  Procedure Date  . Tubal ligation   . Cholecystectomy   . Retinal detachment surgery 2010  . Trigger finger release 2010  . Carpal tunnel release 2010    Bilaterally  . Nerve shealth release 2010    both elbows  . Growth plate surgery     left leg    History   Social History  . Marital Status: Married    Spouse Name: N/A    Number of Children: N/A  . Years of Education: N/A   Occupational History  .      unemployed   Social History Main Topics  . Smoking status: Never Smoker   . Smokeless tobacco: Not on file  . Alcohol Use: No  . Drug Use: No  . Sexually Active:    Other Topics Concern  . Not on file   Social History Narrative   Pt does not get regular exercise    Current Outpatient Prescriptions on File Prior to Visit  Medication Sig Dispense Refill  . aspirin 81 MG tablet Take 81 mg by mouth daily.        . busPIRone (BUSPAR) 15 MG tablet 1 tablet by mouth two times a day       . Calcium Carbonate-Vitamin D (CALCIUM  600+D) 600-200 MG-UNIT TABS Take 2 tablets by mouth daily.        . cyanocobalamin (,VITAMIN B-12,) 1000 MCG/ML injection Inject 1,000 mcg into the muscle. monthly       . ezetimibe-simvastatin (VYTORIN) 10-80 MG per tablet Take 1 tablet by mouth daily.        Marland Kitchen glucose blood test strip Two times a day dx 250.00       . insulin lispro (HUMALOG KWIKPEN) 100 UNIT/ML injection Inject into the skin 3 (three) times daily before meals. (just before each meal) 12-01-48 units.      . insulin NPH (HUMULIN N PEN) 100 UNIT/ML injection Inject 35 Units into the skin at bedtime.       . Insulin Pen Needle (NOVOFINE) 30G X 8 MM MISC Use as directed 3xs daily dx 250.00       . levothyroxine (SYNTHROID, LEVOTHROID) 75 MCG tablet TAKE ONE TABLET BY MOUTH EVERY DAY  30 tablet  5  . losartan-hydrochlorothiazide (HYZAAR) 50-12.5 MG per tablet 1/2 tablet by mouth once daily       . lubiprostone (AMITIZA) 24 MCG capsule Take 24 mcg by mouth  2 (two) times daily with a meal.        . metFORMIN (GLUCOPHAGE) 1000 MG tablet Take 1,000 mg by mouth 2 (two) times daily with a meal.        . nortriptyline (PAMELOR) 75 MG capsule 1 capsule by mouth once daily       . omeprazole (PRILOSEC) 20 MG capsule TAKE TWO CAPSULES BY MOUTH EVERY DAY  60 capsule  10  . DISCONTD: omeprazole (PRILOSEC OTC) 20 MG tablet 2 tablets by mouth once daily         No Known Allergies  Family History  Problem Relation Age of Onset  . Diabetes Mother   . Heart disease Mother   . Heart disease Father   . Cancer Neg Hx     BP 122/78  Pulse 84  Temp(Src) 98.6 F (37 C) (Oral)  Ht 5\' 5"  (1.651 m)  Wt 198 lb (89.812 kg)  BMI 32.95 kg/m2  SpO2 96%  Review of Systems  Constitutional:       Weight gain  Eyes: Negative for visual disturbance.  Respiratory: Negative for shortness of breath.   Genitourinary:       She has excessive urination  Musculoskeletal: Negative for joint swelling.       Leg cramps  Skin:       Denies excessive  diaphoresis  Neurological: Negative for syncope.  Psychiatric/Behavioral: The patient is nervous/anxious.        Restless at night   denies hypoglycemia and weight change    Objective:   Physical Exam GENERAL: no distress There are several flexion contractures of the hands (she has had surgery on several). Skin: several areas, 1 cm diameter, on the legs, of dyshidrosis.    Lab Results  Component Value Date   TSH 0.92 08/23/2010   Lab Results  Component Value Date   HGBA1C 8.8* 08/23/2010   Assessment & Plan:  Post-i-131 hypothyroidism, well-replaced Trigger finger, recurrent Dm, needs increased rx Dyshidrosis, new Depression.  This complicates the rx of dm.

## 2010-08-23 NOTE — Patient Instructions (Addendum)
blood tests are being ordered for you today.  please call [phone] to hear your test results.  You will be prompted to enter the 9-digit "MRN" number that appears at the top left of this page, followed by #.  Then you will hear the message. Please return here in 3 months. increase humalog to 3x a day (just before each meal) 12-01-48 units. check your blood sugar 1 time a day.  vary the time of day when you check, between before the 3 meals, and at bedtime.  also check if you have symptoms of your blood sugar being too high or too low.  please keep a record of the readings and bring it to your next appointment here.  please call us sooner if you are having low blood sugar episodes.  Depending on how much your hands are bothering you, you could see dr Amanda Pea again. Apply hydrocortisone cream as needed to the dry areas on you skin.

## 2010-09-19 ENCOUNTER — Other Ambulatory Visit: Payer: Self-pay | Admitting: *Deleted

## 2010-09-19 MED ORDER — NORTRIPTYLINE HCL 75 MG PO CAPS: ORAL_CAPSULE | ORAL | Status: DC

## 2010-09-19 NOTE — Telephone Encounter (Signed)
Rc'd fax from [facility] for refill of Nortriptyline  Last OV-08/23/2010  Last Filled-2054-11-07

## 2010-11-24 ENCOUNTER — Ambulatory Visit: Payer: PRIVATE HEALTH INSURANCE | Admitting: Endocrinology

## 2010-11-28 ENCOUNTER — Other Ambulatory Visit (INDEPENDENT_AMBULATORY_CARE_PROVIDER_SITE_OTHER): Payer: PRIVATE HEALTH INSURANCE

## 2010-11-28 ENCOUNTER — Ambulatory Visit (INDEPENDENT_AMBULATORY_CARE_PROVIDER_SITE_OTHER): Payer: PRIVATE HEALTH INSURANCE | Admitting: Endocrinology

## 2010-11-28 ENCOUNTER — Encounter: Payer: Self-pay | Admitting: Endocrinology

## 2010-11-28 VITALS — BP 124/82 | HR 69 | Temp 98.1°F | Ht 66.0 in | Wt 198.0 lb

## 2010-11-28 DIAGNOSIS — E890 Postprocedural hypothyroidism: Secondary | ICD-10-CM

## 2010-11-28 DIAGNOSIS — E119 Type 2 diabetes mellitus without complications: Secondary | ICD-10-CM

## 2010-11-28 DIAGNOSIS — Z23 Encounter for immunization: Secondary | ICD-10-CM

## 2010-11-28 DIAGNOSIS — F411 Generalized anxiety disorder: Secondary | ICD-10-CM

## 2010-11-28 LAB — HEMOGLOBIN A1C: Hgb A1c MFr Bld: 8.4 % — ABNORMAL HIGH (ref 4.6–6.5)

## 2010-11-28 LAB — TSH: TSH: 0.81 u[IU]/mL (ref 0.35–5.50)

## 2010-11-28 MED ORDER — INSULIN NPH (HUMAN) (ISOPHANE) 100 UNIT/ML ~~LOC~~ SUSP: SUBCUTANEOUS | Status: DC

## 2010-11-28 MED ORDER — ZOLPIDEM TARTRATE 10 MG PO TABS: 10.0000 mg | ORAL_TABLET | Freq: Every evening | ORAL | Status: DC | PRN

## 2010-11-28 NOTE — Progress Notes (Signed)
Subjective:    Patient ID: Alyssa Fitzpatrick, female    DOB: December 29, 1955, 55 y.o.   MRN: [id number]  HPI Pt is now 10 mos s/p i-131 rx for hyperthyroidism, due to grave's dz. She takes synthroid 75 mcg/d, as rx'ed. Pt says she hasn't been able to buy the nph pens.  she brings a record of her cbg's which i have reviewed today.  It varies from 100-200.  It is highest in am, and lower at other times.   Pt says her mother died approx 2 mos ago.  Since then, she has had depression since then.  She has a few months of moderate "creepy sensation" of the legs, and assoc difficulty with concentration.   Past Medical History  Diagnosis Date  . HYPERTHYROIDISM 11/16/2009  . DM 07/05/2007  . NEPHROPATHY, DIABETIC 07/05/2007  . Pernicious anemia 07/30/2006  . HYPERTENSION 11/18/2007  . GERD 07/30/2006  . OSTEOPOROSIS 07/30/2006  . Gastroparesis 08/20/2007  . ANXIETY 02/08/2010  . MYOFASCIAL PAIN SYNDROME 01/08/2007  . ASYMPTOMATIC POSTMENOPAUSAL STATUS 07/05/2007    Past Surgical History  Procedure Date  . Tubal ligation   . Cholecystectomy   . Retinal detachment surgery 2010  . Trigger finger release 2010  . Carpal tunnel release 2010    Bilaterally  . Nerve shealth release 2010    both elbows  . Growth plate surgery     left leg    History   Social History  . Marital Status: Married    Spouse Name: N/A    Number of Children: N/A  . Years of Education: N/A   Occupational History  .      unemployed   Social History Main Topics  . Smoking status: Never Smoker   . Smokeless tobacco: Not on file  . Alcohol Use: No  . Drug Use: No  . Sexually Active:    Other Topics Concern  . Not on file   Social History Narrative   Pt does not get regular exercise    Current Outpatient Prescriptions on File Prior to Visit  Medication Sig Dispense Refill  . aspirin 81 MG tablet Take 81 mg by mouth daily.        . busPIRone (BUSPAR) 15 MG tablet 1 tablet by mouth two times a day       . Calcium  Carbonate-Vitamin D (CALCIUM 600+D) 600-200 MG-UNIT TABS Take 2 tablets by mouth daily.        . cyanocobalamin (,VITAMIN B-12,) 1000 MCG/ML injection Inject 1,000 mcg into the muscle. monthly       . ezetimibe-simvastatin (VYTORIN) 10-80 MG per tablet Take 1 tablet by mouth daily.        Marland Kitchen glucose blood test strip Two times a day dx 250.00       . insulin lispro (HUMALOG KWIKPEN) 100 UNIT/ML injection Inject into the skin 3 (three) times daily before meals. (just before each meal) 12-01-48 units.      . Insulin Pen Needle (NOVOFINE) 30G X 8 MM MISC Use as directed 3xs daily dx 250.00       . levothyroxine (SYNTHROID, LEVOTHROID) 75 MCG tablet TAKE ONE TABLET BY MOUTH EVERY DAY  30 tablet  5  . losartan-hydrochlorothiazide (HYZAAR) 50-12.5 MG per tablet 1/2 tablet by mouth once daily       . lubiprostone (AMITIZA) 24 MCG capsule Take 24 mcg by mouth 2 (two) times daily with a meal.        . metFORMIN (GLUCOPHAGE) 1000 MG  tablet Take 1,000 mg by mouth 2 (two) times daily with a meal.        . nortriptyline (PAMELOR) 75 MG capsule 1 capsule by mouth once daily  90 capsule  2  . omeprazole (PRILOSEC) 20 MG capsule TAKE TWO CAPSULES BY MOUTH EVERY DAY  60 capsule  10    No Known Allergies  Family History  Problem Relation Age of Onset  . Diabetes Mother   . Heart disease Mother   . Heart disease Father   . Cancer Neg Hx     BP 124/82  Pulse 69  Temp(Src) 98.1 F (36.7 C) (Oral)  Ht 5\' 6"  (1.676 m)  Wt 198 lb (89.812 kg)  BMI 31.96 kg/m2  SpO2 96%  Review of Systems denies hypoglycemia, and weight change.     Objective:   Physical Exam VITAL SIGNS:  See vs page. GENERAL: no distress. PSYCH: Alert and oriented x 3.  She appearsdepressed.    Assessment & Plan:  Dm, therapy limited by pt's request for least expensive meds Anxiety/insomnia--? rls Post-i-131 hypothyroidism, on rx.    (1 skin tag is removed from left neck)

## 2010-11-28 NOTE — Patient Instructions (Addendum)
blood tests are being ordered for you today.  please call [phone] to hear your test results.  You will be prompted to enter the 9-digit "MRN" number that appears at the top left of this page, followed by #.  Then you will hear the message. Please return here in 4 months. pending the test results, please continue the same medications for now continue humalog 3x a day (just before each meal) 12-01-48 units. check your blood sugar 1 time a day.  vary the time of day when you check, between before the 3 meals, and at bedtime.  also check if you have symptoms of your blood sugar being too high or too low.  please keep a record of the readings and bring it to your next appointment here.  please call us sooner if you are having low blood sugar episodes.  Change the bedtime nph insulin to bottles.  i have sent a prescription to your pharmacy, to take 10 units at bedtime. Change nortriptyline to ambien 10 mg at bedtime. Here is a prescription.

## 2010-12-05 ENCOUNTER — Other Ambulatory Visit: Payer: Self-pay | Admitting: Endocrinology

## 2010-12-06 ENCOUNTER — Other Ambulatory Visit: Payer: Self-pay | Admitting: *Deleted

## 2010-12-06 MED ORDER — LEVOTHYROXINE SODIUM 75 MCG PO TABS: ORAL_TABLET | ORAL | Status: DC

## 2010-12-06 NOTE — Telephone Encounter (Signed)
R'cd fax from [facility] for refill of Levothyroxine  Last OV-11/28/2010  Last filled-09/08/2010

## 2010-12-09 ENCOUNTER — Other Ambulatory Visit: Payer: Self-pay

## 2010-12-09 MED ORDER — LOSARTAN POTASSIUM-HCTZ 50-12.5 MG PO TABS: 1.0000 | ORAL_TABLET | Freq: Every day | ORAL | Status: DC

## 2010-12-13 ENCOUNTER — Other Ambulatory Visit: Payer: Self-pay | Admitting: *Deleted

## 2010-12-13 MED ORDER — CYANOCOBALAMIN 1000 MCG/ML IJ SOLN: 1000.0000 ug | INTRAMUSCULAR | Status: DC

## 2010-12-13 NOTE — Telephone Encounter (Signed)
R'cd fax from [facility] for refill of B12  Last filled-11/11/2009  Last OV-11/28/2010

## 2010-12-27 ENCOUNTER — Other Ambulatory Visit: Payer: Self-pay | Admitting: Endocrinology

## 2010-12-27 NOTE — Telephone Encounter (Signed)
Zolpidem request [last refill 11/28/10 #30x0]

## 2010-12-27 NOTE — Telephone Encounter (Signed)
Faxed printed Rx.

## 2011-01-16 ENCOUNTER — Encounter (INDEPENDENT_AMBULATORY_CARE_PROVIDER_SITE_OTHER): Payer: PRIVATE HEALTH INSURANCE | Admitting: Ophthalmology

## 2011-03-28 ENCOUNTER — Ambulatory Visit: Payer: PRIVATE HEALTH INSURANCE | Admitting: Endocrinology

## 2011-04-06 ENCOUNTER — Ambulatory Visit: Payer: PRIVATE HEALTH INSURANCE | Admitting: Endocrinology

## 2011-04-10 ENCOUNTER — Other Ambulatory Visit: Payer: Self-pay | Admitting: Endocrinology

## 2011-04-13 ENCOUNTER — Ambulatory Visit (INDEPENDENT_AMBULATORY_CARE_PROVIDER_SITE_OTHER): Payer: PRIVATE HEALTH INSURANCE | Admitting: Ophthalmology

## 2011-05-10 ENCOUNTER — Ambulatory Visit: Payer: PRIVATE HEALTH INSURANCE | Admitting: Endocrinology

## 2011-06-05 ENCOUNTER — Ambulatory Visit: Payer: PRIVATE HEALTH INSURANCE | Admitting: Endocrinology

## 2011-06-19 ENCOUNTER — Encounter: Payer: Self-pay | Admitting: Endocrinology

## 2011-06-19 ENCOUNTER — Ambulatory Visit (INDEPENDENT_AMBULATORY_CARE_PROVIDER_SITE_OTHER): Payer: PRIVATE HEALTH INSURANCE | Admitting: Endocrinology

## 2011-06-19 ENCOUNTER — Other Ambulatory Visit (INDEPENDENT_AMBULATORY_CARE_PROVIDER_SITE_OTHER): Payer: PRIVATE HEALTH INSURANCE

## 2011-06-19 VITALS — BP 138/76 | HR 72 | Temp 98.6°F | Ht 65.0 in | Wt 195.0 lb

## 2011-06-19 DIAGNOSIS — N058 Unspecified nephritic syndrome with other morphologic changes: Secondary | ICD-10-CM

## 2011-06-19 DIAGNOSIS — I10 Essential (primary) hypertension: Secondary | ICD-10-CM

## 2011-06-19 DIAGNOSIS — E7800 Pure hypercholesterolemia, unspecified: Secondary | ICD-10-CM

## 2011-06-19 DIAGNOSIS — E119 Type 2 diabetes mellitus without complications: Secondary | ICD-10-CM

## 2011-06-19 DIAGNOSIS — E1129 Type 2 diabetes mellitus with other diabetic kidney complication: Secondary | ICD-10-CM

## 2011-06-19 DIAGNOSIS — Z79899 Other long term (current) drug therapy: Secondary | ICD-10-CM

## 2011-06-19 DIAGNOSIS — E890 Postprocedural hypothyroidism: Secondary | ICD-10-CM

## 2011-06-19 LAB — URINALYSIS, ROUTINE W REFLEX MICROSCOPIC
Bilirubin Urine: NEGATIVE
Hgb urine dipstick: NEGATIVE
Ketones, ur: NEGATIVE
Leukocytes, UA: NEGATIVE
Urobilinogen, UA: 0.2 (ref 0.0–1.0)

## 2011-06-19 LAB — CBC WITH DIFFERENTIAL/PLATELET
Basophils Absolute: 0 10*3/uL (ref 0.0–0.1)
Eosinophils Absolute: 0.3 10*3/uL (ref 0.0–0.7)
HCT: 42.1 % (ref 36.0–46.0)
Hemoglobin: 13.8 g/dL (ref 12.0–15.0)
Lymphs Abs: 4 10*3/uL (ref 0.7–4.0)
MCHC: 32.8 g/dL (ref 30.0–36.0)
Neutro Abs: 3.7 10*3/uL (ref 1.4–7.7)
Platelets: 334 10*3/uL (ref 150.0–400.0)
RDW: 15.5 % — ABNORMAL HIGH (ref 11.5–14.6)

## 2011-06-19 LAB — HEMOGLOBIN A1C: Hgb A1c MFr Bld: 7.9 % — ABNORMAL HIGH (ref 4.6–6.5)

## 2011-06-19 MED ORDER — GLUCOSE BLOOD VI STRP: 1.0000 | ORAL_STRIP | Freq: Every day | Status: DC

## 2011-06-19 MED ORDER — "INSULIN SYRINGE-NEEDLE U-100 31G X 5/16"" 0.3 ML MISC": Status: DC

## 2011-06-19 MED ORDER — INSULIN NPH (HUMAN) (ISOPHANE) 100 UNIT/ML ~~LOC~~ SUSP: SUBCUTANEOUS | Status: DC

## 2011-06-19 NOTE — Patient Instructions (Addendum)
Please return here in 4 months. check your blood sugar 1 time a day.  vary the time of day when you check, between before the 3 meals, and at bedtime.  also check if you have symptoms of your blood sugar being too high or too low.  please keep a record of the readings and bring it to your next appointment here.  please call us sooner if you are having low blood sugar episodes.    blood tests are being requested for you today.  You will receive a letter with results. i have sent a prescription to your pharmacy, for test strips.  Increase humalog to 3x a day (just before each meal) 12-02-58 units.

## 2011-06-19 NOTE — Progress Notes (Signed)
Subjective:    Patient ID: Alyssa Fitzpatrick, female    DOB: Apr 08, 1955, 56 y.o.   MRN: [id number]  HPI Pt is now 16 mos s/p i-131 rx for hyperthyroidism, due to grave's dz. She takes synthroid 75 mcg/d, as rx'ed. pt states she feels well in general, except for fatigue. 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 she brings a record of her cbg's which i have reviewed today.  It varies from 100-200's.  It is highest at hs, and lower at other times.   her mother died in Jul 28, 2010.  She is also being affected by illnesses of her father and husband.  She needs ambien to sleep, but it works well. Past Medical History  Diagnosis Date  . HYPERTHYROIDISM 11/16/2009  . DM 07/05/2007  . NEPHROPATHY, DIABETIC 07/05/2007  . Pernicious anemia 07/30/2006  . HYPERTENSION 11/18/2007  . GERD 07/30/2006  . OSTEOPOROSIS 07/30/2006  . Gastroparesis 08/20/2007  . ANXIETY 02/08/2010  . MYOFASCIAL PAIN SYNDROME 01/08/2007  . ASYMPTOMATIC POSTMENOPAUSAL STATUS 07/05/2007    Past Surgical History  Procedure Date  . Tubal ligation   . Cholecystectomy   . Retinal detachment surgery 07-27-2008  . Trigger finger release 2010  . Carpal tunnel release 2010    Bilaterally  . Nerve shealth release 2010    both elbows  . Growth plate surgery     left leg    History   Social History  . Marital Status: Married    Spouse Name: N/A    Number of Children: N/A  . Years of Education: N/A   Occupational History  .      unemployed   Social History Main Topics  . Smoking status: Never Smoker   . Smokeless tobacco: Not on file  . Alcohol Use: No  . Drug Use: No  . Sexually Active:    Other Topics Concern  . Not on file   Social History Narrative   Pt does not get regular exercise    Current Outpatient Prescriptions on File Prior to Visit  Medication Sig Dispense Refill  . aspirin 81 MG tablet Take 81 mg by mouth daily.        . busPIRone (BUSPAR) 15 MG tablet TAKE ONE TABLET BY MOUTH  TWICE DAILY  60 tablet  5  . Calcium Carbonate-Vitamin D (CALCIUM 600+D) 600-200 MG-UNIT TABS Take 2 tablets by mouth daily.        . cyanocobalamin (,VITAMIN B-12,) 1000 MCG/ML injection Inject 1 mL (1,000 mcg total) into the muscle every 30 (thirty) days.  10 mL  3  . ezetimibe-simvastatin (VYTORIN) 10-80 MG per tablet Take 1 tablet by mouth daily.        . insulin lispro (HUMALOG KWIKPEN) 100 UNIT/ML injection Inject into the skin 3 (three) times daily before meals. 3x a day (just before each meal) 12-02-58 units.      . insulin NPH (NOVOLIN N) 100 UNIT/ML injection 10 units at bedtime  10 mL  3  . Insulin Pen Needle (NOVOFINE) 30G X 8 MM MISC Use as directed 3xs daily dx 250.00       . levothyroxine (SYNTHROID, LEVOTHROID) 75 MCG tablet TAKE ONE TABLET BY MOUTH EVERY DAY  30 tablet  5  . losartan-hydrochlorothiazide (HYZAAR) 50-12.5 MG per tablet Take 1 tablet by mouth daily. 1/2 tablet by mouth once daily  45 tablet  3  . lubiprostone (AMITIZA) 24 MCG capsule Take 24 mcg by mouth 2 (two) times daily  with a meal.        . metFORMIN (GLUCOPHAGE) 1000 MG tablet TAKE ONE TABLET BY MOUTH TWICE DAILY WITH FOOD FOR DIABETES  180 tablet  2  . NON FORMULARY Wrists Splints  As needed       . omeprazole (PRILOSEC) 20 MG capsule TAKE TWO CAPSULES BY MOUTH EVERY DAY  60 capsule  10  . zolpidem (AMBIEN) 10 MG tablet TAKE ONE TABLET BY MOUTH EVERY DAY AT BEDTIME AS NEEDED FOR SLEEP  30 tablet  5    No Known Allergies  Family History  Problem Relation Age of Onset  . Diabetes Mother   . Heart disease Mother   . Heart disease Father   . Cancer Neg Hx     BP 138/76  Pulse 72  Temp(Src) 98.6 F (37 C) (Oral)  Ht 5\' 5"  (1.651 m)  Wt 195 lb (88.451 kg)  BMI 32.45 kg/m2  SpO2 95%  Review of Systems denies hypoglycemia    Objective:   Physical Exam VITAL SIGNS:  See vs page GENERAL: no distress Pulses: dorsalis pedis intact bilat.   Feet: no deformity.  no ulcer on the feet.  feet are of  normal color and temp.  no edema Neuro: sensation is intact to touch on the feet  Lab Results  Component Value Date   HGBA1C 7.9* 06/19/2011   Lab Results  Component Value Date   TSH 1.94 06/19/2011      Assessment & Plan:  DM.  needs increased rx Post-i-131 hypothyroidism, well-replaced Insomnia, well-controlled

## 2011-06-20 ENCOUNTER — Encounter: Payer: Self-pay | Admitting: Endocrinology

## 2011-06-20 ENCOUNTER — Telehealth: Payer: Self-pay | Admitting: *Deleted

## 2011-06-20 LAB — HEPATIC FUNCTION PANEL
AST: 24 U/L (ref 0–37)
Alkaline Phosphatase: 52 U/L (ref 39–117)
Bilirubin, Direct: 0.1 mg/dL (ref 0.0–0.3)
Total Protein: 7.5 g/dL (ref 6.0–8.3)

## 2011-06-20 LAB — MICROALBUMIN / CREATININE URINE RATIO
Creatinine,U: 42.2 mg/dL
Microalb Creat Ratio: 0.5 mg/g (ref 0.0–30.0)
Microalb, Ur: 0.2 mg/dL (ref 0.0–1.9)

## 2011-06-20 LAB — TSH: TSH: 1.94 u[IU]/mL (ref 0.35–5.50)

## 2011-06-20 LAB — BASIC METABOLIC PANEL
CO2: 30 mEq/L (ref 19–32)
Calcium: 9.7 mg/dL (ref 8.4–10.5)
Creatinine, Ser: 0.6 mg/dL (ref 0.4–1.2)

## 2011-06-20 LAB — LIPID PANEL
Total CHOL/HDL Ratio: 5
VLDL: 20.8 mg/dL (ref 0.0–40.0)

## 2011-06-20 NOTE — Telephone Encounter (Signed)
Ok.  Please resume

## 2011-06-20 NOTE — Telephone Encounter (Signed)
Pt informed of lab results. Pt had stopped taking her Vytorin for a while per pt.

## 2011-06-20 NOTE — Telephone Encounter (Signed)
Called pt to inform of lab results, left message for pt to callback office (letter also mailed to pt). 

## 2011-06-21 NOTE — Telephone Encounter (Signed)
Pt informed

## 2011-07-10 ENCOUNTER — Other Ambulatory Visit: Payer: Self-pay | Admitting: Endocrinology

## 2011-07-10 NOTE — Telephone Encounter (Signed)
Rx faxed to [facility].  

## 2011-07-10 NOTE — Telephone Encounter (Signed)
Ambien last written 12/27/2010 #30 with 5 refills-please advise.

## 2011-08-01 LAB — HM MAMMOGRAPHY: HM Mammogram: NEGATIVE

## 2011-09-11 ENCOUNTER — Other Ambulatory Visit: Payer: Self-pay | Admitting: Endocrinology

## 2011-09-21 ENCOUNTER — Other Ambulatory Visit: Payer: Self-pay | Admitting: *Deleted

## 2011-09-21 MED ORDER — INSULIN LISPRO 100 UNIT/ML ~~LOC~~ SOLN: SUBCUTANEOUS | Status: DC

## 2011-09-21 NOTE — Telephone Encounter (Signed)
Pt called requesting refill of Humalog to be sent to [facility]. Rx sent, pt informed.

## 2011-10-16 ENCOUNTER — Other Ambulatory Visit (INDEPENDENT_AMBULATORY_CARE_PROVIDER_SITE_OTHER): Payer: PRIVATE HEALTH INSURANCE

## 2011-10-16 ENCOUNTER — Ambulatory Visit (INDEPENDENT_AMBULATORY_CARE_PROVIDER_SITE_OTHER): Payer: PRIVATE HEALTH INSURANCE | Admitting: Endocrinology

## 2011-10-16 ENCOUNTER — Encounter: Payer: Self-pay | Admitting: Endocrinology

## 2011-10-16 VITALS — BP 132/80 | HR 56 | Temp 98.4°F | Resp 98 | Wt 189.5 lb

## 2011-10-16 DIAGNOSIS — E119 Type 2 diabetes mellitus without complications: Secondary | ICD-10-CM

## 2011-10-16 LAB — HEMOGLOBIN A1C: Hgb A1c MFr Bld: 7.9 % — ABNORMAL HIGH (ref 4.6–6.5)

## 2011-10-16 NOTE — Patient Instructions (Addendum)
Please return here in 4 months. check your blood sugar 1 time a day.  vary the time of day when you check, between before the 3 meals, and at bedtime.  also check if you have symptoms of your blood sugar being too high or too low.  please keep a record of the readings and bring it to your next appointment here.  please call us sooner if you are having low blood sugar episodes.    blood tests are being requested for you today.  You will receive a letter with results.  pending the test results, please continue the same medications for now.

## 2011-10-16 NOTE — Progress Notes (Signed)
Subjective:    Patient ID: Alyssa Fitzpatrick, female    DOB: [birth year], 57 y.o.   MRN: [id number]  HPI Pt returns for f/u of IDDM 1981-08-01, complicated by retinopathy and nephropathy). she brings a record of her cbg's which i have reviewed today.  All are in the mid-100's.  There is no trend throughout the day. her mother died in 08/02/2010.  She is also being affected by illnesses of her father and husband.  She needs ambien to sleep, but it works well.   Past Medical History  Diagnosis Date  . HYPERTHYROIDISM 11/16/2009  . DM 07/05/2007  . NEPHROPATHY, DIABETIC 07/05/2007  . Pernicious anemia 07/30/2006  . HYPERTENSION 11/18/2007  . GERD 07/30/2006  . OSTEOPOROSIS 07/30/2006  . Gastroparesis 08/20/2007  . ANXIETY 02/08/2010  . MYOFASCIAL PAIN SYNDROME 01/08/2007  . ASYMPTOMATIC POSTMENOPAUSAL STATUS 07/05/2007    Past Surgical History  Procedure Date  . Tubal ligation   . Cholecystectomy   . Retinal detachment surgery 2008-08-01  . Trigger finger release 2010  . Carpal tunnel release 2010    Bilaterally  . Nerve shealth release 2010    both elbows  . Growth plate surgery     left leg    History   Social History  . Marital Status: Married    Spouse Name: N/A    Number of Children: N/A  . Years of Education: N/A   Occupational History  .      unemployed   Social History Main Topics  . Smoking status: Never Smoker   . Smokeless tobacco: Not on file  . Alcohol Use: No  . Drug Use: No  . Sexually Active:    Other Topics Concern  . Not on file   Social History Narrative   Pt does not get regular exercise    Current Outpatient Prescriptions on File Prior to Visit  Medication Sig Dispense Refill  . aspirin 81 MG tablet Take 81 mg by mouth daily.        . busPIRone (BUSPAR) 15 MG tablet TAKE ONE TABLET BY MOUTH TWICE DAILY  60 tablet  5  . Calcium Carbonate-Vitamin D (CALCIUM 600+D) 600-200 MG-UNIT TABS Take 2 tablets by mouth daily.        . cyanocobalamin (,VITAMIN B-12,) 1000 MCG/ML  injection Inject 1 mL (1,000 mcg total) into the muscle every 30 (thirty) days.  10 mL  3  . ezetimibe-simvastatin (VYTORIN) 10-80 MG per tablet Take 1 tablet by mouth daily.        Marland Kitchen glucose blood (RELION GLUCOSE TEST STRIPS) test strip 1 each by Other route daily. And lancets 1/day 250.01  100 each  12  . insulin lispro (HUMALOG) 100 UNIT/ML injection Inject into the skin 3x a day (just before each meal) 15-10-65 units.      . insulin NPH (NOVOLIN N) 100 UNIT/ML injection 10 units at bedtime  10 mL  3  . Insulin Pen Needle (NOVOFINE) 30G X 8 MM MISC Use as directed 3xs daily dx 250.00       . Insulin Syringe-Needle U-100 (B-D INSULIN SYRINGE) 31G X 5/16" 0.3 ML MISC Use as directed once daily dx 250.00  30 each  5  . levothyroxine (SYNTHROID, LEVOTHROID) 75 MCG tablet TAKE ONE TABLET BY MOUTH EVERY DAY  30 tablet  5  . losartan-hydrochlorothiazide (HYZAAR) 50-12.5 MG per tablet Take 1 tablet by mouth daily. 1/2 tablet by mouth once daily  45 tablet  3  . lubiprostone (AMITIZA) 24  MCG capsule Take 24 mcg by mouth 2 (two) times daily with a meal.        . metFORMIN (GLUCOPHAGE) 1000 MG tablet TAKE ONE TABLET BY MOUTH TWICE DAILY WITH FOOD FOR DIABETES  180 tablet  2  . NON FORMULARY Wrists Splints  As needed       . omeprazole (PRILOSEC) 20 MG capsule TAKE TWO CAPSULES BY MOUTH EVERY DAY  60 capsule  7  . zolpidem (AMBIEN) 10 MG tablet TAKE ONE TABLET BY MOUTH AT BEDTIME AS NEEDED FOR SLEEP  30 tablet  5    No Known Allergies  Family History  Problem Relation Age of Onset  . Diabetes Mother   . Heart disease Mother   . Heart disease Father   . Cancer Neg Hx     BP 132/80  Pulse 56  Temp 98.4 F (36.9 C) (Oral)  Resp 98  Wt 189 lb 8 oz (85.957 kg)  SpO2 96%    Review of Systems denies hypoglycemia    Objective:   Physical Exam VITAL SIGNS:  See vs page GENERAL: no distress Pulses: dorsalis pedis intact bilat.   Feet: no deformity.  no ulcer on the feet.  feet are of  normal color and temp.  no edema Neuro: sensation is intact to touch on the feet   Lab Results  Component Value Date   HGBA1C 7.9* 10/16/2011      Assessment & Plan:  DM.  needs increased rx

## 2011-10-17 ENCOUNTER — Telehealth: Payer: Self-pay | Admitting: *Deleted

## 2011-10-17 NOTE — Telephone Encounter (Signed)
Called pt to inform of lab results, pt informed (letter also mailed to pt). 

## 2011-11-10 ENCOUNTER — Other Ambulatory Visit: Payer: Self-pay | Admitting: Endocrinology

## 2012-01-01 ENCOUNTER — Other Ambulatory Visit: Payer: Self-pay | Admitting: Endocrinology

## 2012-02-01 ENCOUNTER — Ambulatory Visit
Admission: RE | Admit: 2012-02-01 | Discharge: 2012-02-01 | Disposition: A | Discharge status: Home / Self Care | Payer: PRIVATE HEALTH INSURANCE | Source: Ambulatory Visit | Attending: Endocrinology | Admitting: Endocrinology

## 2012-02-01 ENCOUNTER — Ambulatory Visit (INDEPENDENT_AMBULATORY_CARE_PROVIDER_SITE_OTHER): Payer: PRIVATE HEALTH INSURANCE | Admitting: Endocrinology

## 2012-02-01 ENCOUNTER — Encounter: Payer: Self-pay | Admitting: Endocrinology

## 2012-02-01 VITALS — BP 124/70 | HR 78 | Temp 98.6°F | Wt 187.0 lb

## 2012-02-01 DIAGNOSIS — E119 Type 2 diabetes mellitus without complications: Secondary | ICD-10-CM

## 2012-02-01 DIAGNOSIS — R05 Cough: Secondary | ICD-10-CM

## 2012-02-01 LAB — HEMOGLOBIN A1C
Hgb A1c MFr Bld: 7.8 % — ABNORMAL HIGH (ref <5.7)
Mean Plasma Glucose: 177 mg/dL — ABNORMAL HIGH (ref <117)

## 2012-02-01 MED ORDER — PROMETHAZINE-CODEINE 6.25-10 MG/5ML PO SYRP: 5.0000 mL | ORAL_SOLUTION | ORAL | Status: DC | PRN

## 2012-02-01 MED ORDER — CEFUROXIME AXETIL 250 MG PO TABS: 250.0000 mg | ORAL_TABLET | Freq: Two times a day (BID) | ORAL | Status: DC

## 2012-02-01 NOTE — Progress Notes (Signed)
Subjective:    Patient ID: Alyssa Fitzpatrick, female    DOB: [birth year], 56 y.o.   MRN: [id number]  HPI Pt states few days of slightly productive cough in the chest, and assoc pain there.   Pt returns for f/u of IDDM (1983, complicated by retinopathy and nephropathy). no cbg record, but states cbg's are well-controlled.  There is no trend throughout the day. Past Medical History  Diagnosis Date  . HYPERTHYROIDISM 11/16/2009  . DM 07/05/2007  . NEPHROPATHY, DIABETIC 07/05/2007  . Pernicious anemia 07/30/2006  . HYPERTENSION 11/18/2007  . GERD 07/30/2006  . OSTEOPOROSIS 07/30/2006  . Gastroparesis 08/20/2007  . ANXIETY 02/08/2010  . MYOFASCIAL PAIN SYNDROME 01/08/2007  . ASYMPTOMATIC POSTMENOPAUSAL STATUS 07/05/2007    Past Surgical History  Procedure Date  . Tubal ligation   . Cholecystectomy   . Retinal detachment surgery 2010  . Trigger finger release 2010  . Carpal tunnel release 2010    Bilaterally  . Nerve shealth release 2010    both elbows  . Growth plate surgery     left leg    History   Social History  . Marital Status: Married    Spouse Name: N/A    Number of Children: N/A  . Years of Education: N/A   Occupational History  .      unemployed   Social History Main Topics  . Smoking status: Never Smoker   . Smokeless tobacco: Not on file  . Alcohol Use: No  . Drug Use: No  . Sexually Active:    Other Topics Concern  . Not on file   Social History Narrative   Pt does not get regular exercise    Current Outpatient Prescriptions on File Prior to Visit  Medication Sig Dispense Refill  . aspirin 81 MG tablet Take 81 mg by mouth daily.        . busPIRone (BUSPAR) 15 MG tablet TAKE ONE TABLET BY MOUTH TWICE DAILY  60 tablet  4  . Calcium Carbonate-Vitamin D (CALCIUM 600+D) 600-200 MG-UNIT TABS Take 2 tablets by mouth daily.        . cyanocobalamin (,VITAMIN B-12,) 1000 MCG/ML injection Inject 1 mL (1,000 mcg total) into the muscle every 30 (thirty) days.  10 mL  3  .  ezetimibe-simvastatin (VYTORIN) 10-80 MG per tablet Take 1 tablet by mouth daily.        Marland Kitchen glucose blood (RELION GLUCOSE TEST STRIPS) test strip 1 each by Other route daily. And lancets 1/day 250.01  100 each  12  . insulin lispro (HUMALOG) 100 UNIT/ML injection Inject into the skin 3x a day (just before each meal) 15-10-65 units.      . insulin NPH (HUMULIN N,NOVOLIN N) 100 UNIT/ML injection Inject 15 Units into the skin at bedtime.      . Insulin Pen Needle (NOVOFINE) 30G X 8 MM MISC Use as directed 3xs daily dx 250.00       . Insulin Syringe-Needle U-100 (B-D INSULIN SYRINGE) 31G X 5/16" 0.3 ML MISC Use as directed once daily dx 250.00  30 each  5  . levothyroxine (SYNTHROID, LEVOTHROID) 75 MCG tablet TAKE ONE TABLET BY MOUTH EVERY DAY  30 tablet  5  . losartan-hydrochlorothiazide (HYZAAR) 50-12.5 MG per tablet TAKE ONE-HALF TABLET BY MOUTH EVERY DAY  45 tablet  2  . lubiprostone (AMITIZA) 24 MCG capsule Take 24 mcg by mouth 2 (two) times daily with a meal.        . metFORMIN (GLUCOPHAGE)  1000 MG tablet TAKE ONE TABLET BY MOUTH TWICE DAILY WITH FOOD FOR DIABETES  180 tablet  2  . NON FORMULARY Wrists Splints  As needed       . omeprazole (PRILOSEC) 20 MG capsule TAKE TWO CAPSULES BY MOUTH EVERY DAY  60 capsule  7  . zolpidem (AMBIEN) 10 MG tablet TAKE ONE TABLET BY MOUTH AT BEDTIME AS NEEDED FOR SLEEP  30 tablet  4  . Fluticasone-Salmeterol (ADVAIR) 100-50 MCG/DOSE AEPB Inhale 1 puff into the lungs 2 (two) times daily.  1 each  3    No Known Allergies  Family History  Problem Relation Age of Onset  . Diabetes Mother   . Heart disease Mother   . Heart disease Father   . Cancer Neg Hx     BP 124/70  Pulse 78  Temp 98.6 F (37 C) (Oral)  Wt 187 lb (84.823 kg)  SpO2 95%  Review of Systems She has wheezing, but no fever.    Objective:   Physical Exam VITAL SIGNS:  See vs page GENERAL: no distress head: no deformity.   eyes: no periorbital swelling, no proptosis external nose  and ears are normal.   mouth: no lesion seen Both eac's and tm's are slightly red.  LUNGS:  Clear to auscultation.    Lab Results  Component Value Date   HGBA1C 7.8* 02/01/2012      Assessment & Plan:  URI, new DM, needs increased rx

## 2012-02-01 NOTE — Patient Instructions (Addendum)
blood tests, and a chest-x-ray, are being requested for you today.  We'll contact you with results. Loratadine-d (non-prescription) will help your congestion. Here are 2 prescriptions: cough syrup and antibiotic.  I hope you feel better soon.  If you don't feel better by next week, please call back.  Please call sooner if you get worse.

## 2012-02-02 ENCOUNTER — Telehealth: Payer: Self-pay | Admitting: Endocrinology

## 2012-02-02 MED ORDER — FLUTICASONE-SALMETEROL 100-50 MCG/DOSE IN AEPB: 1.0000 | INHALATION_SPRAY | Freq: Two times a day (BID) | RESPIRATORY_TRACT | Status: DC

## 2012-02-02 NOTE — Telephone Encounter (Signed)
Please send Advair Rx to [facility] in [place]

## 2012-02-09 ENCOUNTER — Telehealth: Payer: Self-pay | Admitting: Endocrinology

## 2012-02-09 MED ORDER — FLUTICASONE-SALMETEROL 100-50 MCG/DOSE IN AEPB: 1.0000 | INHALATION_SPRAY | Freq: Two times a day (BID) | RESPIRATORY_TRACT | Status: DC

## 2012-02-09 MED ORDER — AZITHROMYCIN 500 MG PO TABS: 500.0000 mg | ORAL_TABLET | Freq: Every day | ORAL | Status: DC

## 2012-02-09 NOTE — Telephone Encounter (Signed)
PATIENT NOTIFIED OF Rx SENT TO PHARMACY.

## 2012-02-09 NOTE — Telephone Encounter (Signed)
PATIENT STATES HER SYMPTOMS HAVE NOT IMPROVED. HAS TAKEN THE ANTIBIOTIC AND IS USING COUGH MEDICATION  AS ORDERED. STILL WITH COUGH AND CONGESTION IN HER CHEST AND DRAINAGE IN HER THROAT. PATIENT STATES DID NOT GET INHALER FILLED DUE TO COST AND HAD REQUESTED IT BE CALLED TO [facility] INSTEAD OF [facility]. Rx TO [facility] COST TOO MUCH FOR INHALER AND IT WAS NEVER CALLED TO [facility]. PATIENT STATES SHE HAD NO FEVER. MISSED WORK LAST WEEK AND TRIED TO GO BACK THIS WEEK BUT COUGHING GOT WORSE. PLEASE ADVISE. WOULD LIKE MEDICATIONS TO BE CALLED TO [facility] IN [place].

## 2012-02-09 NOTE — Telephone Encounter (Signed)
i sent rx 

## 2012-02-09 NOTE — Telephone Encounter (Signed)
i need more description of sxs.

## 2012-02-09 NOTE — Telephone Encounter (Signed)
Pt called req another antibiotic to be call into [facility] in [place]. Pt stated that had an ov in 02/01/12 and pt was told to call back if not feel any better. Please call pt back if this is ok and update her new pharmacy.

## 2012-02-12 ENCOUNTER — Ambulatory Visit: Payer: PRIVATE HEALTH INSURANCE | Admitting: Endocrinology

## 2012-02-16 ENCOUNTER — Other Ambulatory Visit: Payer: Self-pay

## 2012-02-16 MED ORDER — AMOXICILLIN-POT CLAVULANATE 875-125 MG PO TABS: 1.0000 | ORAL_TABLET | Freq: Two times a day (BID) | ORAL | Status: DC

## 2012-02-16 NOTE — Telephone Encounter (Signed)
Tried to call pt to find out more about her spx >> got VM. Will retry.

## 2012-02-16 NOTE — Telephone Encounter (Signed)
Tried 2 more times to call pt this pm, no answer.

## 2012-02-16 NOTE — Telephone Encounter (Signed)
Pt called to let you her sx's tickle in her throat, and a dry non-productive cough.

## 2012-02-16 NOTE — Telephone Encounter (Signed)
Called pt: she continues to have cough (Azithromycin helped, but cough back, now non-productive). She has facial congestion and ear popping. No fever. No wheezing. Will call in Augmentin bid for 5 days. Advised to call us if not better in 5 days or if she develops wheezing.

## 2012-03-26 ENCOUNTER — Encounter (INDEPENDENT_AMBULATORY_CARE_PROVIDER_SITE_OTHER): Payer: BC Managed Care – PPO | Admitting: Ophthalmology

## 2012-03-26 DIAGNOSIS — H35039 Hypertensive retinopathy, unspecified eye: Secondary | ICD-10-CM

## 2012-03-26 DIAGNOSIS — H3581 Retinal edema: Secondary | ICD-10-CM

## 2012-03-26 DIAGNOSIS — I10 Essential (primary) hypertension: Secondary | ICD-10-CM

## 2012-03-26 DIAGNOSIS — E1165 Type 2 diabetes mellitus with hyperglycemia: Secondary | ICD-10-CM

## 2012-03-26 DIAGNOSIS — E11319 Type 2 diabetes mellitus with unspecified diabetic retinopathy without macular edema: Secondary | ICD-10-CM

## 2012-03-26 DIAGNOSIS — H43819 Vitreous degeneration, unspecified eye: Secondary | ICD-10-CM

## 2012-04-01 ENCOUNTER — Ambulatory Visit (INDEPENDENT_AMBULATORY_CARE_PROVIDER_SITE_OTHER): Payer: BC Managed Care – PPO | Admitting: Endocrinology

## 2012-04-01 ENCOUNTER — Other Ambulatory Visit: Payer: Self-pay

## 2012-04-01 ENCOUNTER — Encounter: Payer: Self-pay | Admitting: Endocrinology

## 2012-04-01 VITALS — BP 122/68 | HR 84 | Temp 98.2°F | Resp 16 | Wt 195.0 lb

## 2012-04-01 DIAGNOSIS — R062 Wheezing: Secondary | ICD-10-CM | POA: Insufficient documentation

## 2012-04-01 MED ORDER — DOXYCYCLINE HYCLATE 100 MG PO TABS: 100.0000 mg | ORAL_TABLET | Freq: Two times a day (BID) | ORAL | Status: DC

## 2012-04-01 MED ORDER — PROMETHAZINE-CODEINE 6.25-10 MG/5ML PO SYRP: 5.0000 mL | ORAL_SOLUTION | ORAL | Status: AC | PRN

## 2012-04-01 MED ORDER — FLUTICASONE-SALMETEROL 100-50 MCG/DOSE IN AEPB: 1.0000 | INHALATION_SPRAY | Freq: Two times a day (BID) | RESPIRATORY_TRACT | Status: DC

## 2012-04-01 MED ORDER — METHYLPREDNISOLONE (PAK) 4 MG PO TABS: ORAL_TABLET | ORAL | Status: DC

## 2012-04-01 MED ORDER — GLUCOSE BLOOD VI STRP: 1.0000 | ORAL_STRIP | Freq: Every day | Status: DC

## 2012-04-01 NOTE — Patient Instructions (Addendum)
Refer to a lung specialist.  you will receive a phone call, about a day and time for an appointment. i have sent 3 prescriptions to your pharmacy: to refill the inhaler, steroid "pack,"and for an antibiotic. Please come in for a regular physical after 06/18/12.   Here is a refill for the cough syrup.

## 2012-04-01 NOTE — Progress Notes (Signed)
Subjective:    Patient ID: Alyssa Fitzpatrick, female    DOB: 1955/10/06, 57 y.o.   MRN: [id number]  HPI Pt states 2 weeks of moderate wheezing in the chest, and assoc slightly prod cough. Pt returns for f/u of IDDM (dx'ed 1983, complicated by retinopathy and nephropathy). no cbg record, but states cbg's are well-controlled.   Past Medical History  Diagnosis Date  . HYPERTHYROIDISM 11/16/2009  . DM 07/05/2007  . NEPHROPATHY, DIABETIC 07/05/2007  . Pernicious anemia 07/30/2006  . HYPERTENSION 11/18/2007  . GERD 07/30/2006  . OSTEOPOROSIS 07/30/2006  . Gastroparesis 08/20/2007  . ANXIETY 02/08/2010  . MYOFASCIAL PAIN SYNDROME 01/08/2007  . ASYMPTOMATIC POSTMENOPAUSAL STATUS 07/05/2007    Past Surgical History  Procedure Date  . Tubal ligation   . Cholecystectomy   . Retinal detachment surgery 2010  . Trigger finger release 2010  . Carpal tunnel release 2010    Bilaterally  . Nerve shealth release 2010    both elbows  . Growth plate surgery     left leg    History   Social History  . Marital Status: Married    Spouse Name: N/A    Number of Children: N/A  . Years of Education: N/A   Occupational History  .      unemployed   Social History Main Topics  . Smoking status: Passive Smoke Exposure - Never Smoker  . Smokeless tobacco: Not on file  . Alcohol Use: No  . Drug Use: No  . Sexually Active: Not on file   Other Topics Concern  . Not on file   Social History Narrative   Pt does not get regular exercise    Current Outpatient Prescriptions on File Prior to Visit  Medication Sig Dispense Refill  . aspirin 81 MG tablet Take 81 mg by mouth daily.        . busPIRone (BUSPAR) 15 MG tablet TAKE ONE TABLET BY MOUTH TWICE DAILY  60 tablet  4  . Calcium Carbonate-Vitamin D (CALCIUM 600+D) 600-200 MG-UNIT TABS Take 2 tablets by mouth daily.        . cyanocobalamin (,VITAMIN B-12,) 1000 MCG/ML injection Inject 1 mL (1,000 mcg total) into the muscle every 30 (thirty) days.  10 mL  3   . insulin lispro (HUMALOG) 100 UNIT/ML injection Inject into the skin 3x a day (just before each meal) 15-10-65 units.      . insulin NPH (HUMULIN N,NOVOLIN N) 100 UNIT/ML injection Inject 15 Units into the skin at bedtime.      . Insulin Pen Needle (NOVOFINE) 30G X 8 MM MISC Use as directed 3xs daily dx 250.00       . Insulin Syringe-Needle U-100 (B-D INSULIN SYRINGE) 31G X 5/16" 0.3 ML MISC Use as directed once daily dx 250.00  30 each  5  . levothyroxine (SYNTHROID, LEVOTHROID) 75 MCG tablet TAKE ONE TABLET BY MOUTH EVERY DAY  30 tablet  5  . losartan-hydrochlorothiazide (HYZAAR) 50-12.5 MG per tablet TAKE ONE-HALF TABLET BY MOUTH EVERY DAY  45 tablet  2  . metFORMIN (GLUCOPHAGE) 1000 MG tablet TAKE ONE TABLET BY MOUTH TWICE DAILY WITH FOOD FOR DIABETES  180 tablet  2  . omeprazole (PRILOSEC) 20 MG capsule TAKE TWO CAPSULES BY MOUTH EVERY DAY  60 capsule  7  . zolpidem (AMBIEN) 10 MG tablet TAKE ONE TABLET BY MOUTH AT BEDTIME AS NEEDED FOR SLEEP  30 tablet  4  . ezetimibe-simvastatin (VYTORIN) 10-80 MG per tablet Take 1 tablet  by mouth daily.        . Fluticasone-Salmeterol (ADVAIR) 100-50 MCG/DOSE AEPB Inhale 1 puff into the lungs 2 (two) times daily.  1 each  3  . lubiprostone (AMITIZA) 24 MCG capsule Take 24 mcg by mouth 2 (two) times daily with a meal.        . NON FORMULARY Wrists Splints  As needed         No Known Allergies  Family History  Problem Relation Age of Onset  . Diabetes Mother   . Heart disease Mother   . Heart disease Father   . Cancer Neg Hx    BP 122/68  Pulse 84  Temp 98.2 F (36.8 C) (Oral)  Resp 16  Wt 195 lb (88.451 kg)  SpO2 95%  Review of Systems denies hypoglycemia and fever.     Objective:   Physical Exam VITAL SIGNS:  See vs page GENERAL: no distress head: no deformity eyes: no periorbital swelling, no proptosis external nose and ears are normal mouth: no lesion seen Both eac's and tm's are normal. LUNGS:  Clear to auscultation      Assessment & Plan:  Acute bronchitis, recurrent. DM, apparently well-controlled.

## 2012-04-22 ENCOUNTER — Other Ambulatory Visit: Payer: Self-pay | Admitting: Endocrinology

## 2012-04-24 ENCOUNTER — Telehealth: Payer: Self-pay | Admitting: Emergency Medicine

## 2012-04-24 ENCOUNTER — Encounter: Payer: Self-pay | Admitting: Emergency Medicine

## 2012-04-24 ENCOUNTER — Ambulatory Visit (INDEPENDENT_AMBULATORY_CARE_PROVIDER_SITE_OTHER): Payer: BC Managed Care – PPO | Admitting: Emergency Medicine

## 2012-04-24 ENCOUNTER — Telehealth: Payer: Self-pay

## 2012-04-24 VITALS — BP 104/66 | HR 68 | Temp 98.1°F | Ht 65.0 in | Wt 198.4 lb

## 2012-04-24 DIAGNOSIS — R062 Wheezing: Secondary | ICD-10-CM

## 2012-04-24 MED ORDER — ESOMEPRAZOLE MAGNESIUM 40 MG PO CPDR: 40.0000 mg | DELAYED_RELEASE_CAPSULE | Freq: Every day | ORAL | Status: DC

## 2012-04-24 MED ORDER — FUROSEMIDE 20 MG PO TABS: 20.0000 mg | ORAL_TABLET | Freq: Two times a day (BID) | ORAL | Status: DC

## 2012-04-24 NOTE — Patient Instructions (Addendum)
Start loratadine (Claritin) 10mg  daily until next visit Start nasonex 2 sprays each side daily Temporarily stop your omeprazole and start nexium 40mg  twice a day for two weeks. Then go back to your omeprazole.  We will perform full breathing testing at your next visit Follow with Dr Delton Coombes in 1 month with full PFT's

## 2012-04-24 NOTE — Telephone Encounter (Signed)
Spoke with patient, made her aware to stop Advair per RB.  Verbalized understanding and nothing further needed at this time.

## 2012-04-24 NOTE — Telephone Encounter (Signed)
Pt states her fluid pill is not working, she is having a lot of swelling what to do?

## 2012-04-24 NOTE — Progress Notes (Signed)
Subjective:    Patient ID: Alyssa Fitzpatrick, female    DOB: 1955/10/17, 57 y.o.   MRN: [id number]  HPI 57 yo woman, never smoker, hx of HTN, allergic rhinitis, DM, gastroparesis. Was well until 11/13 when she had ? URI around that time that started cough. The cough has persisted >> has been rx with pred and abx on several occasions. Usually non-prod but sometimes clear to yellow. She is on omeprazole 1-2 x a day, still w some breakthrough GERD sx. Her cough and throat noise have slowly resolved some but not completely better. She started Advair about 3 weeks ago and it seems to have really helped her. She has built up LE edema during this time, ? Related to pred.  She wasn't treated for allergies during this time.   Also - she mentions that her family has a hx of ILD. Her CXR 02/02/12 was clear   Review of Systems  Constitutional: Positive for unexpected weight change. Negative for fever.  HENT: Positive for ear pain and congestion. Negative for nosebleeds, sore throat, rhinorrhea, sneezing, trouble swallowing, dental problem, postnasal drip and sinus pressure.   Eyes: Negative for redness and itching.  Respiratory: Positive for cough, chest tightness, shortness of breath and wheezing.   Cardiovascular: Positive for chest pain. Negative for palpitations and leg swelling.  Gastrointestinal: Negative for nausea and vomiting.  Genitourinary: Negative for dysuria.  Musculoskeletal: Negative for joint swelling.  Skin: Negative for rash.  Neurological: Positive for headaches.  Hematological: Does not bruise/bleed easily.  Psychiatric/Behavioral: Positive for dysphoric mood. The patient is not nervous/anxious.    Past Medical History  Diagnosis Date  . HYPERTHYROIDISM 11/16/2009  . DM 07/05/2007  . NEPHROPATHY, DIABETIC 07/05/2007  . Pernicious anemia 07/30/2006  . HYPERTENSION 11/18/2007  . GERD 07/30/2006  . OSTEOPOROSIS 07/30/2006  . Gastroparesis 08/20/2007  . ANXIETY 02/08/2010  . MYOFASCIAL PAIN  SYNDROME 01/08/2007  . ASYMPTOMATIC POSTMENOPAUSAL STATUS 07/05/2007  . Hyperlipidemia   . Sinus trouble   . Diabetes      Family History  Problem Relation Age of Onset  . Diabetes Mother   . Heart disease Mother   . Heart disease Father   . Cancer Neg Hx      History   Social History  . Marital Status: Married    Spouse Name: N/A    Number of Children: 2  . 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 Smoking status: Passive Smoke Exposure - Never Smoker  . Smokeless tobacco: Never Used  . Alcohol Use: No  . Drug Use: No  . Sexually Active: Not on file   Other Topics Concern  . Not on file   Social History Narrative   Pt does not get regular exercise     No Known Allergies   Outpatient Prescriptions Prior to Visit  Medication Sig Dispense Refill  . aspirin 81 MG tablet Take 81 mg by mouth daily.        . busPIRone (BUSPAR) 15 MG tablet TAKE (1) TABLET BY MOUTH TWICE DAILY.  60 tablet  0  . Calcium Carbonate-Vitamin D (CALCIUM 600+D) 600-200 MG-UNIT TABS Take 2 tablets by mouth daily.        . cyanocobalamin (,VITAMIN B-12,) 1000 MCG/ML injection Inject 1 mL (1,000 mcg total) into the muscle every 30 (thirty) days.  10 mL  3  . ezetimibe-simvastatin (VYTORIN) 10-80 MG per tablet Take  1 tablet by mouth daily.        . Fluticasone-Salmeterol (ADVAIR) 100-50 MCG/DOSE AEPB Inhale 1 puff into the lungs 2 (two) times daily.  1 each  3  . glucose blood (RELION GLUCOSE TEST STRIPS) test strip 1 each by Other route daily. And lancets 1/day 250.01  100 each  12  . insulin lispro (HUMALOG) 100 UNIT/ML injection Inject into the skin 3x a day (just before each meal) 15-10-65 units.      . insulin NPH (HUMULIN N,NOVOLIN N) 100 UNIT/ML injection Inject 15 Units into the skin at bedtime.      . Insulin Pen Needle (NOVOFINE) 30G X 8 MM MISC Use as directed 3xs daily dx 250.00       . levothyroxine (SYNTHROID, LEVOTHROID) 75 MCG  tablet TAKE ONE TABLET BY MOUTH EVERY DAY  30 tablet  5  . losartan-hydrochlorothiazide (HYZAAR) 50-12.5 MG per tablet TAKE ONE-HALF TABLET BY MOUTH EVERY DAY  45 tablet  2  . metFORMIN (GLUCOPHAGE) 1000 MG tablet TAKE ONE TABLET BY MOUTH TWICE DAILY WITH FOOD FOR DIABETES  180 tablet  2  . NON FORMULARY Wrists Splints  As needed       . omeprazole (PRILOSEC) 20 MG capsule TAKE TWO CAPSULES BY MOUTH EVERY DAY  60 capsule  7  . zolpidem (AMBIEN) 10 MG tablet TAKE ONE TABLET BY MOUTH AT BEDTIME AS NEEDED FOR SLEEP  30 tablet  4  . Insulin Syringe-Needle U-100 (B-D INSULIN SYRINGE) 31G X 5/16" 0.3 ML MISC Use as directed once daily dx 250.00  30 each  5  . doxycycline (VIBRA-TABS) 100 MG tablet Take 1 tablet (100 mg total) by mouth 2 (two) times daily.  14 tablet  0  . lubiprostone (AMITIZA) 24 MCG capsule Take 24 mcg by mouth 2 (two) times daily with a meal.        . methylPREDNIsolone (MEDROL DOSPACK) 4 MG tablet follow package directions  21 tablet  0   No facility-administered medications prior to visit.         Objective:   Physical Exam Filed Vitals:   04/24/12 1517  BP: 104/66  Pulse: 68  Temp: 98.1 F (36.7 C)   Gen: Pleasant, well-nourished, in no distress,  normal affect  ENT: No lesions,  mouth clear,  oropharynx clear, no postnasal drip  Neck: No JVD, no TMG, no carotid bruits  Lungs: No use of accessory muscles, no dullness to percussion, clear without rales or rhonchi  Cardiovascular: RRR, heart sounds normal, no murmur or gallops, trace to 1+ peripheral edem  Musculoskeletal: No deformities, no cyanosis or clubbing  Neuro: alert, non focal  Skin: Warm, no lesions or rashes     Assessment & Plan:  Wheezing With chronic cough. Suspect that this is UA irritation, initial insult was her URI then exacerbating factors have been PND and her GERD (moderately controlled).  - will empirically treat the allergies - upgrade GERD rx for two weeks, then go back to  omeprazole - PFT next visit - stop advair for now - rov 1 month

## 2012-04-24 NOTE — Telephone Encounter (Signed)
ATC patient [email or web address]. Patient seen today in the office.    Late add to patient instructions per RB: patient needs to stop Advair 100 at this time.

## 2012-04-24 NOTE — Telephone Encounter (Signed)
Please change losartan-hctz to furosemide.  i have sent a prescription to your pharmacy.  Your blood pressure is too low to take both.

## 2012-04-24 NOTE — Assessment & Plan Note (Addendum)
With chronic cough. Suspect that this is UA irritation, initial insult was her URI then exacerbating factors have been PND and her GERD (moderately controlled).  - will empirically treat the allergies - upgrade GERD rx for two weeks, then go back to omeprazole - PFT next visit - stop advair for now - rov 1 month

## 2012-04-25 NOTE — Telephone Encounter (Signed)
Pt advised.

## 2012-04-29 ENCOUNTER — Telehealth: Payer: Self-pay | Admitting: Endocrinology

## 2012-04-29 NOTE — Telephone Encounter (Signed)
Left message on v-mail for pt 

## 2012-04-29 NOTE — Telephone Encounter (Signed)
Patient picked up Rx for Furosemide. There were only 30 pills in the bottle & the patient takes it twice a day. Please advise patient what to do to correct.

## 2012-04-29 NOTE — Telephone Encounter (Signed)
It should be qd.  i have changed on your med list.

## 2012-05-16 ENCOUNTER — Telehealth: Payer: Self-pay | Admitting: *Deleted

## 2012-05-16 ENCOUNTER — Ambulatory Visit (INDEPENDENT_AMBULATORY_CARE_PROVIDER_SITE_OTHER): Payer: BC Managed Care – PPO | Admitting: Endocrinology

## 2012-05-16 ENCOUNTER — Encounter: Payer: Self-pay | Admitting: Endocrinology

## 2012-05-16 VITALS — BP 124/80 | HR 66 | Wt 198.0 lb

## 2012-05-16 DIAGNOSIS — R0609 Other forms of dyspnea: Secondary | ICD-10-CM

## 2012-05-16 DIAGNOSIS — E119 Type 2 diabetes mellitus without complications: Secondary | ICD-10-CM

## 2012-05-16 DIAGNOSIS — R0600 Dyspnea, unspecified: Secondary | ICD-10-CM | POA: Insufficient documentation

## 2012-05-16 LAB — BRAIN NATRIURETIC PEPTIDE: Pro B Natriuretic peptide (BNP): 14 pg/mL (ref 0.0–100.0)

## 2012-05-16 NOTE — Progress Notes (Signed)
Subjective:    Patient ID: Alyssa Fitzpatrick, female    DOB: 09/08/1955, 57 y.o.   MRN: [id number]  HPI Pt returns for f/u of IDDM (dx'ed 1983, complicated by retinopathy and nephropathy). no cbg record, but states cbg's are well-controlled. She has mild hypoglycemia at hs, a few times per week.  Otherwise, there is no trend throughout the day.   Pt states few mos of moderate swelling in the legs, and assoc doe.   Past Medical History  Diagnosis Date  . HYPERTHYROIDISM 11/16/2009  . DM 07/05/2007  . NEPHROPATHY, DIABETIC 07/05/2007  . Pernicious anemia 07/30/2006  . HYPERTENSION 11/18/2007  . GERD 07/30/2006  . OSTEOPOROSIS 07/30/2006  . Gastroparesis 08/20/2007  . ANXIETY 02/08/2010  . MYOFASCIAL PAIN SYNDROME 01/08/2007  . ASYMPTOMATIC POSTMENOPAUSAL STATUS 07/05/2007  . Hyperlipidemia   . Sinus trouble   . Diabetes     Past Surgical History  Procedure Laterality Date  . Tubal ligation    . Cholecystectomy    . Retinal detachment surgery  2010  . Trigger finger release  2010  . Carpal tunnel release  2010    Bilaterally  . Nerve shealth release  2010    both elbows  . Growth plate surgery      left leg    History   Social History  . Marital Status: Married    Spouse Name: N/A    Number of Children: 2  . 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 Smoking status: Passive Smoke Exposure - Never Smoker  . Smokeless tobacco: Never Used  . Alcohol Use: No  . Drug Use: No  . Sexually Active: Not on file   Other Topics Concern  . Not on file   Social History Narrative   Pt does not get regular exercise    Current Outpatient Prescriptions on File Prior to Visit  Medication Sig Dispense Refill  . aspirin 81 MG tablet Take 81 mg by mouth daily.        . busPIRone (BUSPAR) 15 MG tablet TAKE (1) TABLET BY MOUTH TWICE DAILY.  60 tablet  0  . Calcium Carbonate-Vitamin D (CALCIUM 600+D) 600-200 MG-UNIT TABS Take 2 tablets by  mouth daily.        . cyanocobalamin (,VITAMIN B-12,) 1000 MCG/ML injection Inject 1 mL (1,000 mcg total) into the muscle every 30 (thirty) days.  10 mL  3  . docusate sodium (COLACE) 100 MG capsule Take 100 mg by mouth 2 (two) times daily.      Marland Kitchen esomeprazole (NEXIUM) 40 MG capsule Take 1 capsule (40 mg total) by mouth daily before breakfast.  30 capsule  0  . ezetimibe-simvastatin (VYTORIN) 10-80 MG per tablet Take 1 tablet by mouth daily.        . Fluticasone-Salmeterol (ADVAIR) 100-50 MCG/DOSE AEPB Inhale 1 puff into the lungs 2 (two) times daily.  1 each  3  . furosemide (LASIX) 20 MG tablet Take 20 mg by mouth daily.      Marland Kitchen glucose blood (RELION GLUCOSE TEST STRIPS) test strip 1 each by Other route daily. And lancets 1/day 250.01  100 each  12  . insulin lispro (HUMALOG) 100 UNIT/ML injection Inject into the skin 3x a day (just before each meal) 20-15-60 units.      . insulin NPH (HUMULIN N,NOVOLIN N) 100 UNIT/ML injection Inject 20 Units into the skin at bedtime.       Marland Kitchen  Insulin Pen Needle (NOVOFINE) 30G X 8 MM MISC Use as directed 3xs daily dx 250.00       . levothyroxine (SYNTHROID, LEVOTHROID) 75 MCG tablet TAKE ONE TABLET BY MOUTH EVERY DAY  30 tablet  5  . NON FORMULARY Wrists Splints  As needed       . omeprazole (PRILOSEC) 20 MG capsule TAKE TWO CAPSULES BY MOUTH EVERY DAY  60 capsule  7  . zolpidem (AMBIEN) 10 MG tablet TAKE ONE TABLET BY MOUTH AT BEDTIME AS NEEDED FOR SLEEP  30 tablet  4   No current facility-administered medications on file prior to visit.    No Known Allergies  Family History  Problem Relation Age of Onset  . Diabetes Mother   . Heart disease Mother   . Heart disease Father   . Cancer Neg Hx     BP 124/80  Pulse 66  Wt 198 lb (89.812 kg)  BMI 32.95 kg/m2  SpO2 97%     Review of Systems Pt states fatigue and difficulty with concentration.      Objective:   Physical Exam VITAL SIGNS:  See vs page GENERAL: no distress Pulses: dorsalis  pedis intact bilat.   Feet: no deformity.  no ulcer on the feet.  feet are of normal color and temp.  no edema Neuro: sensation is intact to touch on the feet     Lab Results  Component Value Date   WBC 8.7 06/19/2011   HGB 13.8 06/19/2011   HCT 42.1 06/19/2011   PLT 334.0 06/19/2011   GLUCOSE 95 06/19/2011   CHOL 226* 06/19/2011   TRIG 104.0 06/19/2011   HDL 49.90 06/19/2011   LDLDIRECT 164.7 06/19/2011   LDLCALC 65 11/10/2009   ALT 26 06/19/2011   AST 24 06/19/2011   NA 141 06/19/2011   K 5.2* 06/19/2011   CL 103 06/19/2011   CREATININE 0.6 06/19/2011   BUN 15 06/19/2011   CO2 30 06/19/2011   TSH 1.94 06/19/2011   INR 1.0 RATIO 09/12/2007   HGBA1C 8.3* 05/16/2012   MICROALBUR 0.2 06/19/2011  BNP is low    Assessment & Plan:  Edema, mild.   Fatigue, possibly due to hypovolemia DM, needs increased rx

## 2012-05-16 NOTE — Patient Instructions (Addendum)
check your blood sugar twice a day.  vary the time of day when you check, between before the 3 meals, and at bedtime.  also check if you have symptoms of your blood sugar being too high or too low.  please keep a record of the readings and bring it to your next appointment here.  please call us sooner if your blood sugar goes below 70, or if you have a lot of readings over 200.  blood tests are being requested for you today.  We'll contact you with results.   Please come back for a regular physical appointment in 3 months.

## 2012-05-16 NOTE — Telephone Encounter (Signed)
Called pt and advise her per Dr Everardo All to please increase NPH to 20 units qhs Please increase humalog to 3x a day (just before each meal) 20-15-60 units. Blood test says you do not have excess fluid and she may feel better if you minimize the fluid pill. Pt understood.

## 2012-05-20 ENCOUNTER — Other Ambulatory Visit: Payer: Self-pay | Admitting: Endocrinology

## 2012-05-21 NOTE — Telephone Encounter (Signed)
Lamar Laundry, can this phone be closed?

## 2012-05-22 ENCOUNTER — Ambulatory Visit: Payer: BC Managed Care – PPO | Admitting: Emergency Medicine

## 2012-05-24 ENCOUNTER — Other Ambulatory Visit: Payer: Self-pay | Admitting: Endocrinology

## 2012-05-24 MED ORDER — OMEPRAZOLE 20 MG PO CPDR: DELAYED_RELEASE_CAPSULE | ORAL | Status: DC

## 2012-06-05 ENCOUNTER — Other Ambulatory Visit: Payer: Self-pay | Admitting: Endocrinology

## 2012-06-05 MED ORDER — ZOLPIDEM TARTRATE 10 MG PO TABS: 10.0000 mg | ORAL_TABLET | Freq: Every evening | ORAL | Status: DC | PRN

## 2012-06-17 ENCOUNTER — Other Ambulatory Visit: Payer: Self-pay | Admitting: Endocrinology

## 2012-06-21 ENCOUNTER — Other Ambulatory Visit: Payer: Self-pay | Admitting: *Deleted

## 2012-06-21 MED ORDER — INSULIN NPH (HUMAN) (ISOPHANE) 100 UNIT/ML ~~LOC~~ SUSP: SUBCUTANEOUS | Status: DC

## 2012-06-21 NOTE — Telephone Encounter (Signed)
Encounter opened in error

## 2012-06-24 ENCOUNTER — Encounter: Payer: Self-pay | Admitting: Endocrinology

## 2012-06-24 ENCOUNTER — Ambulatory Visit (INDEPENDENT_AMBULATORY_CARE_PROVIDER_SITE_OTHER): Payer: BC Managed Care – PPO | Admitting: Endocrinology

## 2012-06-24 ENCOUNTER — Ambulatory Visit: Payer: BC Managed Care – PPO | Admitting: Emergency Medicine

## 2012-06-24 VITALS — BP 128/70 | HR 88 | Wt 199.0 lb

## 2012-06-24 DIAGNOSIS — R9431 Abnormal electrocardiogram [ECG] [EKG]: Secondary | ICD-10-CM | POA: Insufficient documentation

## 2012-06-24 DIAGNOSIS — Z79899 Other long term (current) drug therapy: Secondary | ICD-10-CM

## 2012-06-24 DIAGNOSIS — E890 Postprocedural hypothyroidism: Secondary | ICD-10-CM

## 2012-06-24 DIAGNOSIS — E119 Type 2 diabetes mellitus without complications: Secondary | ICD-10-CM

## 2012-06-24 DIAGNOSIS — I10 Essential (primary) hypertension: Secondary | ICD-10-CM

## 2012-06-24 DIAGNOSIS — E7800 Pure hypercholesterolemia, unspecified: Secondary | ICD-10-CM

## 2012-06-24 LAB — MICROALBUMIN / CREATININE URINE RATIO
Creatinine,U: 86.9 mg/dL
Microalb, Ur: 0.2 mg/dL (ref 0.0–1.9)

## 2012-06-24 LAB — BASIC METABOLIC PANEL
Calcium: 8.7 mg/dL (ref 8.4–10.5)
Chloride: 104 mEq/L (ref 96–112)
Creatinine, Ser: 0.8 mg/dL (ref 0.4–1.2)
GFR: 75.02 mL/min (ref >60.00)

## 2012-06-24 LAB — CBC WITH DIFFERENTIAL/PLATELET
Basophils Absolute: 0 10*3/uL (ref 0.0–0.1)
Eosinophils Absolute: 0.1 10*3/uL (ref 0.0–0.7)
HCT: 41.2 % (ref 36.0–46.0)
Hemoglobin: 13.8 g/dL (ref 12.0–15.0)
Lymphs Abs: 0.9 10*3/uL (ref 0.7–4.0)
MCHC: 33.6 g/dL (ref 30.0–36.0)
Monocytes Relative: 7.9 % (ref 3.0–12.0)
Neutro Abs: 9.5 10*3/uL — ABNORMAL HIGH (ref 1.4–7.7)
RDW: 13.9 % (ref 11.5–14.6)

## 2012-06-24 LAB — LIPID PANEL
HDL: 42.6 mg/dL (ref >39.00)
LDL Cholesterol: 40 mg/dL (ref 0–99)
Total CHOL/HDL Ratio: 2
Triglycerides: 83 mg/dL (ref 0.0–149.0)

## 2012-06-24 LAB — URINALYSIS, ROUTINE W REFLEX MICROSCOPIC
Bilirubin Urine: NEGATIVE
Ketones, ur: NEGATIVE
Leukocytes, UA: NEGATIVE
pH: 6 (ref 5.0–8.0)

## 2012-06-24 LAB — HEPATIC FUNCTION PANEL
Albumin: 3.8 g/dL (ref 3.5–5.2)
Total Protein: 6.7 g/dL (ref 6.0–8.3)

## 2012-06-24 MED ORDER — "INSULIN SYRINGE-NEEDLE U-100 31G X 15/64"" 0.5 ML MISC": 1.0000 | Freq: Every day | Status: DC

## 2012-06-24 MED ORDER — EZETIMIBE-SIMVASTATIN 10-80 MG PO TABS: 1.0000 | ORAL_TABLET | Freq: Every day | ORAL | Status: DC

## 2012-06-24 MED ORDER — GLUCOSE BLOOD VI STRP: 1.0000 | ORAL_STRIP | Freq: Every day | Status: DC

## 2012-06-24 MED ORDER — INSULIN LISPRO 100 UNIT/ML ~~LOC~~ SOLN: SUBCUTANEOUS | Status: DC

## 2012-06-24 MED ORDER — VENLAFAXINE HCL ER 75 MG PO TB24: 1.0000 | ORAL_TABLET | Freq: Every day | ORAL | Status: DC

## 2012-06-24 NOTE — Patient Instructions (Addendum)
let's check and "echocardiogram."  you will receive a phone call, about a day and time for an appointment.   Please reduce NPH to 15 units at bedtime.   Please continue humalog to 3x a day (just before each meal) 20-15-60 units.   please consider these measures for your health:  minimize alcohol.  do not use tobacco products.  have a colonoscopy at least every 10 years from age 58.  Women should have an annual mammogram from age 46.  keep firearms safely stored.  always use seat belts.  have working smoke alarms in your home.  see an eye doctor and dentist regularly.  never drive under the influence of alcohol or drugs (including prescription drugs).  those with fair skin should take precautions against the sun. Change the buspirone to "venlafaxine."  Please call if you do not feel better in 2 weeks, so we can increase it.

## 2012-06-24 NOTE — Progress Notes (Signed)
Subjective:    Patient ID: Alyssa Fitzpatrick, female    DOB: March 04, 1955, 57 y.o.   MRN: [id number]  HPI here for regular wellness examination.  she's feeling pretty well in general, and says chronic med probs are stable, except as noted below. Past Medical History  Diagnosis Date  . HYPERTHYROIDISM 11/16/2009  . DM 07/05/2007  . NEPHROPATHY, DIABETIC 07/05/2007  . Pernicious anemia 07/30/2006  . HYPERTENSION 11/18/2007  . GERD 07/30/2006  . OSTEOPOROSIS 07/30/2006  . Gastroparesis 08/20/2007  . ANXIETY 02/08/2010  . MYOFASCIAL PAIN SYNDROME 01/08/2007  . ASYMPTOMATIC POSTMENOPAUSAL STATUS 07/05/2007  . Hyperlipidemia   . Sinus trouble   . Diabetes     Past Surgical History  Procedure Laterality Date  . Tubal ligation    . Cholecystectomy    . Retinal detachment surgery  2010  . Trigger finger release  2010  . Carpal tunnel release  2010    Bilaterally  . Nerve shealth release  2010    both elbows  . Growth plate surgery      left leg    History   Social History  . Marital Status: Married    Spouse Name: N/A    Number of Children: 2  . 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 Smoking status: Passive Smoke Exposure - Never Smoker  . Smokeless tobacco: Never Used  . Alcohol Use: No  . Drug Use: No  . Sexually Active: Not on file   Other Topics Concern  . Not on file   Social History Narrative   Pt does not get regular exercise    Current Outpatient Prescriptions on File Prior to Visit  Medication Sig Dispense Refill  . aspirin 81 MG tablet Take 81 mg by mouth daily.        . Calcium Carbonate-Vitamin D (CALCIUM 600+D) 600-200 MG-UNIT TABS Take 2 tablets by mouth daily.        . cyanocobalamin (,VITAMIN B-12,) 1000 MCG/ML injection Inject 1 mL (1,000 mcg total) into the muscle every 30 (thirty) days.  10 mL  3  . docusate sodium (COLACE) 100 MG capsule Take 100 mg by mouth 2 (two) times daily.      Marland Kitchen esomeprazole  (NEXIUM) 40 MG capsule Take 1 capsule (40 mg total) by mouth daily before breakfast.  30 capsule  0  . Fluticasone-Salmeterol (ADVAIR) 100-50 MCG/DOSE AEPB Inhale 1 puff into the lungs 2 (two) times daily.  1 each  3  . furosemide (LASIX) 20 MG tablet Take 20 mg by mouth daily.      . Insulin Pen Needle (NOVOFINE) 30G X 8 MM MISC Use as directed 3xs daily dx 250.00       . NON FORMULARY Wrists Splints  As needed       . omeprazole (PRILOSEC) 20 MG capsule TAKE 2 CAPSULES DAILY.  60 capsule  3  . zolpidem (AMBIEN) 10 MG tablet Take 1 tablet (10 mg total) by mouth at bedtime as needed for sleep.  30 tablet  4   No current facility-administered medications on file prior to visit.    No Known Allergies  Family History  Problem Relation Age of Onset  . Diabetes Mother   . Heart disease Mother   . Heart disease Father   . Cancer Neg Hx     BP 128/70  Pulse 88  Wt 199 lb (90.266 kg)  BMI 33.12 kg/m2  SpO2 98%     Review of Systems  Constitutional: Negative for fever.       Numerous sxs  HENT: Negative for hearing loss.   Eyes: Negative for visual disturbance.  Respiratory: Negative for cough.   Cardiovascular: Positive for leg swelling.  Gastrointestinal: Negative for anal bleeding.  Endocrine: Positive for cold intolerance.  Genitourinary: Negative for hematuria.  Musculoskeletal: Negative for back pain.  Skin: Negative for rash.  Allergic/Immunologic: Positive for environmental allergies.  Neurological: Negative for syncope.  Hematological: Does not bruise/bleed easily.  Psychiatric/Behavioral: Positive for dysphoric mood.       Objective:   Physical Exam VS: see vs page GEN: no distress HEAD: head: no deformity eyes: no periorbital swelling, no proptosis external nose and ears are normal mouth: no lesion seen NECK: supple, thyroid is not enlarged CHEST WALL: no deformity LUNGS:  Clear to auscultation BREASTS:  sees gyn CV: reg rate and rhythm, no  murmur ABD: abdomen is soft, nontender.  no hepatosplenomegaly.  not distended.  no hernia GENITALIA/RECTAL: sees gyn MUSCULOSKELETAL: muscle bulk and strength are grossly normal.  no obvious joint swelling.  gait is normal and steady PULSES: no carotid bruit NEURO:  cn 2-12 grossly intact.   readily moves all 4's.  SKIN:  Normal texture and temperature.  No rash or suspicious lesion is visible.   NODES:  None palpable at the neck PSYCH: alert, oriented x3.  Does not appear anxious nor depressed.    Assessment & Plan:  Wellness visit today, with problems stable, except as noted.  we discussed code status.  pt requests full code, but would not want to be started or maintained on artificial life-support measures if there was not a reasonable chance of recovery.       SEPARATE EVALUATION FOLLOWS--EACH PROBLEM HERE IS NEW, NOT RESPONDING TO TREATMENT, OR POSES SIGNIFICANT RISK TO THE PATIENT'S HEALTH:   HISTORY OF THE PRESENT ILLNESS: Pt states few mos of slight dyspnea sensation in the chest,  But no assoc chest pain. PAST MEDICAL HISTORY reviewed and up to date today.   REVIEW OF SYSTEMS: She has weight gain.  She reports anxiety.   PHYSICAL EXAMINATION: VITAL SIGNS:  See vs page GENERAL: no distress Pulses: dorsalis pedis intact bilat.   Feet: no deformity. feet are of normal color and temp.  no edema Skin:  no ulcer on the feet.   Neuro: sensation is intact to touch on the feet LAB/XRAY RESULTS: Lab Results  Component Value Date   WBC 11.4* 06/24/2012   HGB 13.8 06/24/2012   HCT 41.2 06/24/2012   PLT 281.0 06/24/2012   GLUCOSE 212* 06/24/2012   CHOL 99 06/24/2012   TRIG 83.0 06/24/2012   HDL 42.60 06/24/2012   LDLDIRECT 164.7 06/19/2011   LDLCALC 40 06/24/2012   ALT 25 06/24/2012   AST 25 06/24/2012   NA 140 06/24/2012   K 4.2 06/24/2012   CL 104 06/24/2012   CREATININE 0.8 06/24/2012   BUN 20 06/24/2012   CO2 30 06/24/2012   TSH 0.31* 06/24/2012   INR 1.0 RATIO 09/12/2007   HGBA1C  8.3* 05/16/2012   MICROALBUR 0.2 06/24/2012  IMPRESSION: abnormal ecg, new, uncertain etiology Depression/anxeity, needs increased rx DM: Based on the pattern of her cbg's, she needs some adjustment in her therapy PLAN: See instruction page

## 2012-06-25 ENCOUNTER — Telehealth: Payer: Self-pay | Admitting: Endocrinology

## 2012-06-25 MED ORDER — LEVOTHYROXINE SODIUM 50 MCG PO TABS: 50.0000 ug | ORAL_TABLET | Freq: Every day | ORAL | Status: DC

## 2012-06-25 MED ORDER — ATORVASTATIN CALCIUM 80 MG PO TABS: 80.0000 mg | ORAL_TABLET | Freq: Every day | ORAL | Status: DC

## 2012-06-25 NOTE — Telephone Encounter (Signed)
Left message on machine.

## 2012-06-25 NOTE — Telephone Encounter (Signed)
please call patient: Due to insurance i changed vytorin to lipitor. i have sent a prescription to your pharmacy.

## 2012-06-26 ENCOUNTER — Other Ambulatory Visit (HOSPITAL_COMMUNITY): Payer: BC Managed Care – PPO

## 2012-06-27 ENCOUNTER — Ambulatory Visit (HOSPITAL_COMMUNITY): Payer: BC Managed Care – PPO | Attending: Endocrinology | Admitting: Radiology

## 2012-06-27 DIAGNOSIS — I10 Essential (primary) hypertension: Secondary | ICD-10-CM | POA: Insufficient documentation

## 2012-06-27 DIAGNOSIS — R9431 Abnormal electrocardiogram [ECG] [EKG]: Secondary | ICD-10-CM | POA: Insufficient documentation

## 2012-06-27 DIAGNOSIS — E785 Hyperlipidemia, unspecified: Secondary | ICD-10-CM | POA: Insufficient documentation

## 2012-06-27 DIAGNOSIS — E119 Type 2 diabetes mellitus without complications: Secondary | ICD-10-CM | POA: Insufficient documentation

## 2012-06-27 NOTE — Progress Notes (Signed)
Echocardiogram performed.  

## 2012-07-16 ENCOUNTER — Other Ambulatory Visit: Payer: Self-pay

## 2012-07-16 MED ORDER — FUROSEMIDE 20 MG PO TABS: 20.0000 mg | ORAL_TABLET | Freq: Every day | ORAL | Status: DC

## 2012-07-31 ENCOUNTER — Ambulatory Visit (INDEPENDENT_AMBULATORY_CARE_PROVIDER_SITE_OTHER): Payer: BC Managed Care – PPO | Admitting: Ophthalmology

## 2012-07-31 DIAGNOSIS — H33009 Unspecified retinal detachment with retinal break, unspecified eye: Secondary | ICD-10-CM

## 2012-07-31 DIAGNOSIS — H35039 Hypertensive retinopathy, unspecified eye: Secondary | ICD-10-CM

## 2012-07-31 DIAGNOSIS — E11319 Type 2 diabetes mellitus with unspecified diabetic retinopathy without macular edema: Secondary | ICD-10-CM

## 2012-07-31 DIAGNOSIS — I10 Essential (primary) hypertension: Secondary | ICD-10-CM

## 2012-07-31 DIAGNOSIS — H43819 Vitreous degeneration, unspecified eye: Secondary | ICD-10-CM

## 2012-08-16 ENCOUNTER — Ambulatory Visit: Payer: BC Managed Care – PPO | Admitting: Endocrinology

## 2012-08-16 DIAGNOSIS — Z0289 Encounter for other administrative examinations: Secondary | ICD-10-CM

## 2012-09-16 ENCOUNTER — Other Ambulatory Visit: Payer: Self-pay | Admitting: *Deleted

## 2012-09-16 MED ORDER — OMEPRAZOLE 20 MG PO CPDR: DELAYED_RELEASE_CAPSULE | ORAL | Status: DC

## 2012-09-16 NOTE — Telephone Encounter (Signed)
Rx request to pharmacy/[facility]  

## 2012-11-01 ENCOUNTER — Other Ambulatory Visit: Payer: Self-pay | Admitting: Endocrinology

## 2012-11-04 ENCOUNTER — Other Ambulatory Visit: Payer: Self-pay | Admitting: *Deleted

## 2012-11-04 ENCOUNTER — Other Ambulatory Visit: Payer: Self-pay

## 2012-11-04 MED ORDER — ZOLPIDEM TARTRATE 10 MG PO TABS: 10.0000 mg | ORAL_TABLET | Freq: Every evening | ORAL | Status: DC | PRN

## 2012-11-11 ENCOUNTER — Other Ambulatory Visit: Payer: Self-pay | Admitting: Endocrinology

## 2012-11-19 ENCOUNTER — Other Ambulatory Visit: Payer: Self-pay | Admitting: Endocrinology

## 2012-11-29 ENCOUNTER — Encounter: Payer: Self-pay | Admitting: Endocrinology

## 2012-11-29 ENCOUNTER — Ambulatory Visit (INDEPENDENT_AMBULATORY_CARE_PROVIDER_SITE_OTHER): Payer: BC Managed Care – PPO | Admitting: Endocrinology

## 2012-11-29 VITALS — BP 126/74 | HR 60 | Wt 205.0 lb

## 2012-11-29 DIAGNOSIS — E890 Postprocedural hypothyroidism: Secondary | ICD-10-CM

## 2012-11-29 DIAGNOSIS — G5600 Carpal tunnel syndrome, unspecified upper limb: Secondary | ICD-10-CM

## 2012-11-29 DIAGNOSIS — G5601 Carpal tunnel syndrome, right upper limb: Secondary | ICD-10-CM

## 2012-11-29 DIAGNOSIS — E119 Type 2 diabetes mellitus without complications: Secondary | ICD-10-CM

## 2012-11-29 LAB — TSH: TSH: 2.614 u[IU]/mL (ref 0.350–4.500)

## 2012-11-29 NOTE — Patient Instructions (Addendum)
blood tests are being requested for you today.  We'll contact you with results. Please increase humalog to  3x a day (just before each meal) 35-10-60 units. increase NPH to 20 units at bedtime.   check your blood sugar twice a day.  vary the time of day when you check, between before the 3 meals, and at bedtime.  also check if you have symptoms of your blood sugar being too high or too low.  please keep a record of the readings and bring it to your next appointment here.  please call us sooner if your blood sugar goes below 70, or if you have a lot of readings over 200.   Please come back for a follow-up appointment in January.

## 2012-11-29 NOTE — Progress Notes (Signed)
Subjective:    Patient ID: Alyssa Fitzpatrick, female    DOB: 04/04/1955, 57 y.o.   MRN: [id number]  HPI Pt returns for f/u of IDDM (dx'ed 1983, complicated by retinopathy and nephropathy). She has mild hypoglycemia in the afternoon, but it is higher at all other times of day.  she brings a record of her cbg's which i have reviewed today.  At supper, she takes only 35 units of humalog.   She has few weeks of moderate pain at the right wrist, but no assoc numbness.  Wrist splint does not help.  Past Medical History  Diagnosis Date  . HYPERTHYROIDISM 11/16/2009  . DM 07/05/2007  . NEPHROPATHY, DIABETIC 07/05/2007  . Pernicious anemia 07/30/2006  . HYPERTENSION 11/18/2007  . GERD 07/30/2006  . OSTEOPOROSIS 07/30/2006  . Gastroparesis 08/20/2007  . ANXIETY 02/08/2010  . MYOFASCIAL PAIN SYNDROME 01/08/2007  . ASYMPTOMATIC POSTMENOPAUSAL STATUS 07/05/2007  . Hyperlipidemia   . Sinus trouble   . Diabetes     Past Surgical History  Procedure Laterality Date  . Tubal ligation    . Cholecystectomy    . Retinal detachment surgery  2010  . Trigger finger release  2010  . Carpal tunnel release  2010    Bilaterally  . Nerve shealth release  2010    both elbows  . Growth plate surgery      left leg    History   Social History  . Marital Status: Married    Spouse Name: N/A    Number of Children: 2  . 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 Smoking status: Passive Smoke Exposure - Never Smoker  . Smokeless tobacco: Never Used  . Alcohol Use: No  . Drug Use: No  . Sexual Activity: Not on file   Other Topics Concern  . Not on file   Social History Narrative   Pt does not get regular exercise    Current Outpatient Prescriptions on File Prior to Visit  Medication Sig Dispense Refill  . aspirin 81 MG tablet Take 81 mg by mouth daily.        Marland Kitchen atorvastatin (LIPITOR) 80 MG tablet Take 1 tablet (80 mg total) by mouth daily.  90 tablet  3   . Calcium Carbonate-Vitamin D (CALCIUM 600+D) 600-200 MG-UNIT TABS Take 2 tablets by mouth daily.        . cyanocobalamin (,VITAMIN B-12,) 1000 MCG/ML injection Inject 1 mL (1,000 mcg total) into the muscle every 30 (thirty) days.  10 mL  3  . docusate sodium (COLACE) 100 MG capsule Take 100 mg by mouth 2 (two) times daily.      Marland Kitchen esomeprazole (NEXIUM) 40 MG capsule Take 1 capsule (40 mg total) by mouth daily before breakfast.  30 capsule  0  . Fluticasone-Salmeterol (ADVAIR) 100-50 MCG/DOSE AEPB Inhale 1 puff into the lungs 2 (two) times daily.  1 each  3  . furosemide (LASIX) 20 MG tablet TAKE (1) TABLET BY MOUTH ONCE DAILY.  30 tablet  6  . glucose blood (RELION GLUCOSE TEST STRIPS) test strip 1 each by Other route daily. And lancets 1/day 250.01  100 each  12  . Insulin Pen Needle (NOVOFINE) 30G X 8 MM MISC Use as directed 3xs daily dx 250.00       . Insulin Syringe-Needle U-100 31G X 15/64" 0.5 ML MISC 1 Device by Does not apply route  daily.  30 each  11  . levothyroxine (SYNTHROID, LEVOTHROID) 50 MCG tablet Take 1 tablet (50 mcg total) by mouth daily before breakfast.  30 tablet  11  . NON FORMULARY Wrists Splints  As needed       . NOVOLIN N 100 UNIT/ML injection INJECT 10 UNITS SQ. AT BEDTIME.  10 mL  6  . omeprazole (PRILOSEC) 20 MG capsule TAKE 2 CAPSULES DAILY.  60 capsule  3  . Venlafaxine HCl 75 MG TB24 Take 1 tablet (75 mg total) by mouth daily.  30 each  11  . zolpidem (AMBIEN) 10 MG tablet Take 1 tablet (10 mg total) by mouth at bedtime as needed for sleep.  30 tablet  4   No current facility-administered medications on file prior to visit.    No Known Allergies  Family History  Problem Relation Age of Onset  . Diabetes Mother   . Heart disease Mother   . Heart disease Father   . Cancer Neg Hx    BP 126/74  Pulse 60  Wt 205 lb (92.987 kg)  BMI 34.11 kg/m2  SpO2 97%  Review of Systems Denies LOC.  She has weight gain.      Objective:   Physical Exam VITAL  SIGNS:  See vs page GENERAL: no distress. Lab Results  Component Value Date   HGBA1C 8.6* 11/29/2012   Lab Results  Component Value Date   TSH 2.614 11/29/2012      Assessment & Plan:  Wrist pain, new, uncertain etiology: ? Radicular DM: This insulin regimen was chosen from multiple options, as it best matches her insulin to her changing requirements throughout the day.  The benefits of glycemic control must be weighed against the risks of hypoglycemia.  She needs increased rx.

## 2012-11-30 NOTE — Progress Notes (Signed)
Subjective:    Patient ID: Alyssa Fitzpatrick, female    DOB: 1955/07/17, 57 y.o.   MRN: [id number]  HPI Pt returns for f/u of IDDM (dx'ed 1983: she has mild if any neuropathy of the lower extremities, but she has associated retinopathy and nephropathy).  She has mild hypoglycemia in the afternoon, but it is higher at all other times of day.  she brings a record of her cbg's which i have reviewed today.  At supper, she takes only 35 units of humalog.   She has few weeks of moderate pain at the right wrist, but no assoc numbness.  Wrist splint does not help.  Past Medical History  Diagnosis Date  . HYPERTHYROIDISM 11/16/2009  . DM 07/05/2007  . NEPHROPATHY, DIABETIC 07/05/2007  . Pernicious anemia 07/30/2006  . HYPERTENSION 11/18/2007  . GERD 07/30/2006  . OSTEOPOROSIS 07/30/2006  . Gastroparesis 08/20/2007  . ANXIETY 02/08/2010  . MYOFASCIAL PAIN SYNDROME 01/08/2007  . ASYMPTOMATIC POSTMENOPAUSAL STATUS 07/05/2007  . Hyperlipidemia   . Sinus trouble   . Diabetes     Past Surgical History  Procedure Laterality Date  . Tubal ligation    . Cholecystectomy    . Retinal detachment surgery  2010  . Trigger finger release  2010  . Carpal tunnel release  2010    Bilaterally  . Nerve shealth release  2010    both elbows  . Growth plate surgery      left leg    History   Social History  . Marital Status: Married    Spouse Name: N/A    Number of Children: 2  . 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 Smoking status: Passive Smoke Exposure - Never Smoker  . Smokeless tobacco: Never Used  . Alcohol Use: No  . Drug Use: No  . Sexual Activity: Not on file   Other Topics Concern  . Not on file   Social History Narrative   Pt does not get regular exercise    Current Outpatient Prescriptions on File Prior to Visit  Medication Sig Dispense Refill  . aspirin 81 MG tablet Take 81 mg by mouth daily.        Marland Kitchen atorvastatin (LIPITOR) 80 MG  tablet Take 1 tablet (80 mg total) by mouth daily.  90 tablet  3  . Calcium Carbonate-Vitamin D (CALCIUM 600+D) 600-200 MG-UNIT TABS Take 2 tablets by mouth daily.        . cyanocobalamin (,VITAMIN B-12,) 1000 MCG/ML injection Inject 1 mL (1,000 mcg total) into the muscle every 30 (thirty) days.  10 mL  3  . docusate sodium (COLACE) 100 MG capsule Take 100 mg by mouth 2 (two) times daily.      Marland Kitchen esomeprazole (NEXIUM) 40 MG capsule Take 1 capsule (40 mg total) by mouth daily before breakfast.  30 capsule  0  . Fluticasone-Salmeterol (ADVAIR) 100-50 MCG/DOSE AEPB Inhale 1 puff into the lungs 2 (two) times daily.  1 each  3  . furosemide (LASIX) 20 MG tablet TAKE (1) TABLET BY MOUTH ONCE DAILY.  30 tablet  6  . glucose blood (RELION GLUCOSE TEST STRIPS) test strip 1 each by Other route daily. And lancets 1/day 250.01  100 each  12  . Insulin Pen Needle (NOVOFINE) 30G X 8 MM MISC Use as directed 3xs daily dx 250.00       . Insulin Syringe-Needle U-100  31G X 15/64" 0.5 ML MISC 1 Device by Does not apply route daily.  30 each  11  . levothyroxine (SYNTHROID, LEVOTHROID) 50 MCG tablet Take 1 tablet (50 mcg total) by mouth daily before breakfast.  30 tablet  11  . NON FORMULARY Wrists Splints  As needed       . NOVOLIN N 100 UNIT/ML injection INJECT 10 UNITS SQ. AT BEDTIME.  10 mL  6  . omeprazole (PRILOSEC) 20 MG capsule TAKE 2 CAPSULES DAILY.  60 capsule  3  . Venlafaxine HCl 75 MG TB24 Take 1 tablet (75 mg total) by mouth daily.  30 each  11  . zolpidem (AMBIEN) 10 MG tablet Take 1 tablet (10 mg total) by mouth at bedtime as needed for sleep.  30 tablet  4   No current facility-administered medications on file prior to visit.   No Known Allergies  Family History  Problem Relation Age of Onset  . Diabetes Mother   . Heart disease Mother   . Heart disease Father   . Cancer Neg Hx    BP 126/74  Pulse 60  Wt 205 lb (92.987 kg)  BMI 34.11 kg/m2  SpO2 97%  Review of Systems Denies LOC.  She  has weight gain.      Objective:     Physical Exam VITAL SIGNS:  See vs page GENERAL: no distress  Lab Results  Component Value Date   HGBA1C 8.6* 11/29/2012      Assessment & Plan:  Wrist pain, new, uncertain etiology: ? radicular

## 2012-12-16 ENCOUNTER — Telehealth: Payer: Self-pay | Admitting: Endocrinology

## 2012-12-16 NOTE — Telephone Encounter (Signed)
Please get set up with a new primary dr in [place], who can refer you.

## 2012-12-18 NOTE — Telephone Encounter (Signed)
Pt advised.

## 2013-01-30 ENCOUNTER — Ambulatory Visit (INDEPENDENT_AMBULATORY_CARE_PROVIDER_SITE_OTHER): Payer: BC Managed Care – PPO | Admitting: Ophthalmology

## 2013-01-30 DIAGNOSIS — H33009 Unspecified retinal detachment with retinal break, unspecified eye: Secondary | ICD-10-CM

## 2013-01-30 DIAGNOSIS — E1039 Type 1 diabetes mellitus with other diabetic ophthalmic complication: Secondary | ICD-10-CM

## 2013-01-30 DIAGNOSIS — H35039 Hypertensive retinopathy, unspecified eye: Secondary | ICD-10-CM

## 2013-01-30 DIAGNOSIS — H43819 Vitreous degeneration, unspecified eye: Secondary | ICD-10-CM

## 2013-01-30 DIAGNOSIS — I10 Essential (primary) hypertension: Secondary | ICD-10-CM

## 2013-01-30 DIAGNOSIS — E11319 Type 2 diabetes mellitus with unspecified diabetic retinopathy without macular edema: Secondary | ICD-10-CM

## 2013-03-10 ENCOUNTER — Ambulatory Visit: Payer: BC Managed Care – PPO | Admitting: Endocrinology

## 2013-03-17 ENCOUNTER — Other Ambulatory Visit: Payer: Self-pay | Admitting: Endocrinology

## 2013-04-08 ENCOUNTER — Telehealth (HOSPITAL_COMMUNITY): Payer: Self-pay | Admitting: Dietician

## 2013-04-08 NOTE — Telephone Encounter (Signed)
Received message at 1512. Called back at 1514. Pt registered for DM group class on 04/10/13 at 1730.

## 2013-04-08 NOTE — Telephone Encounter (Signed)
Called and left message at 1500.

## 2013-04-08 NOTE — Telephone Encounter (Signed)
Received referral via fax from Dr. Fagan for dx: diabetes 

## 2013-04-09 ENCOUNTER — Other Ambulatory Visit: Payer: Self-pay | Admitting: Endocrinology

## 2013-04-10 ENCOUNTER — Encounter (HOSPITAL_COMMUNITY): Payer: Self-pay | Admitting: Dietician

## 2013-04-10 ENCOUNTER — Other Ambulatory Visit: Payer: Self-pay | Admitting: *Deleted

## 2013-04-10 MED ORDER — ZOLPIDEM TARTRATE 10 MG PO TABS: 10.0000 mg | ORAL_TABLET | Freq: Every evening | ORAL | Status: DC | PRN

## 2013-04-10 NOTE — Progress Notes (Signed)
[facility] Diabetes Class Completion  Date:April 10, 2013  Time: 1730  Pt attended [facility]'s Diabetes Group Education Class on April 10, 2013.   Patient was educated on the following topics:   -Survival skills (signs and symptoms of hyperglycemia and hypoglycemia, treatment for hypoglycemia, ideal levels for fasting and postprandial blood sugars, goal Hgb A1c level, foot care basics)  -Recommendations for physical activity   -Carbohydrate metabolism in relation to diabetes   -Meal planning (sources of carbohydrate, carbohydrate counting, meal planning strategies, food label reading, and portion control).  Handouts provided:  -"Diabetes and You: Taking Charge of Your Health"  -"Carbohydrate Counting and Meal Planning"  -"Your Guide to Better Office Visits"   Camden Mazzaferro A. Roderick Calo, RD, LDN  

## 2013-04-10 NOTE — Telephone Encounter (Signed)
Script faxed to pharmacy

## 2013-04-10 NOTE — Telephone Encounter (Signed)
i printed refill x 1 Ov is due 

## 2013-05-21 ENCOUNTER — Other Ambulatory Visit: Payer: Self-pay | Admitting: Endocrinology

## 2013-08-04 ENCOUNTER — Ambulatory Visit (INDEPENDENT_AMBULATORY_CARE_PROVIDER_SITE_OTHER): Payer: BC Managed Care – PPO | Admitting: Ophthalmology

## 2013-08-04 DIAGNOSIS — E1039 Type 1 diabetes mellitus with other diabetic ophthalmic complication: Secondary | ICD-10-CM

## 2013-08-04 DIAGNOSIS — E11319 Type 2 diabetes mellitus with unspecified diabetic retinopathy without macular edema: Secondary | ICD-10-CM

## 2013-08-04 DIAGNOSIS — I10 Essential (primary) hypertension: Secondary | ICD-10-CM

## 2013-08-04 DIAGNOSIS — H33009 Unspecified retinal detachment with retinal break, unspecified eye: Secondary | ICD-10-CM

## 2013-08-04 DIAGNOSIS — H35039 Hypertensive retinopathy, unspecified eye: Secondary | ICD-10-CM

## 2013-08-04 DIAGNOSIS — E1065 Type 1 diabetes mellitus with hyperglycemia: Secondary | ICD-10-CM

## 2013-08-04 DIAGNOSIS — H43819 Vitreous degeneration, unspecified eye: Secondary | ICD-10-CM

## 2013-08-22 ENCOUNTER — Encounter: Payer: Self-pay | Admitting: Obstetrics and Gynecology

## 2013-08-22 ENCOUNTER — Other Ambulatory Visit (HOSPITAL_COMMUNITY)
Admission: RE | Admit: 2013-08-22 | Discharge: 2013-08-22 | Disposition: A | Discharge status: Home / Self Care | Payer: BC Managed Care – PPO | Source: Ambulatory Visit | Attending: Obstetrics and Gynecology | Admitting: Obstetrics and Gynecology

## 2013-08-22 ENCOUNTER — Ambulatory Visit (INDEPENDENT_AMBULATORY_CARE_PROVIDER_SITE_OTHER): Payer: BC Managed Care – PPO | Admitting: Obstetrics and Gynecology

## 2013-08-22 VITALS — BP 120/78 | Ht 65.0 in | Wt 208.0 lb

## 2013-08-22 DIAGNOSIS — Z01419 Encounter for gynecological examination (general) (routine) without abnormal findings: Secondary | ICD-10-CM | POA: Insufficient documentation

## 2013-08-22 DIAGNOSIS — Z1151 Encounter for screening for human papillomavirus (HPV): Secondary | ICD-10-CM | POA: Insufficient documentation

## 2013-08-22 NOTE — Progress Notes (Signed)
Patient ID: Alyssa Fitzpatrick, female   DOB: [birth year], [age] y.o.   MRN: [id number]  This chart was scribed by Alyssa Fitzpatrick, Medical Scribe, for Dr. Christin BachJohn Fitzpatrick on 08/22/13 at 11:47 AM. This chart was reviewed by Dr. Christin BachJohn Fitzpatrick for accuracy.    Assessment:  Annual Gyn Exam   Plan:  1. pap smear done, next pap due 3 years 2. return annually or prn 3    Annual mammogram advised 4. Discussed exercise techniques for DM management. Subjective:  Alyssa Fitzpatrick is a 58 y.o. female No obstetric history on file. who presents for annual exam. No LMP recorded. Patient is postmenopausal. The patient has no complaints. Patient sees Dr. Ouida SillsFagan for DM management.   The following portions of the patient's history were reviewed and updated as appropriate: allergies, current medications, past family history, past medical history, past social history, past surgical history and problem list.  Review of Systems No complaints at this time  Objective:  BP 120/78  Ht 5\' 5"  (1.651 m)  Wt 208 lb (94.348 kg)  BMI 34.61 kg/m2   BMI: Body mass index is 34.61 kg/(m^2).  General Appearance: Alert, appropriate appearance for age. No acute distress HEENT: Grossly normal Neck / Thyroid:  Cardiovascular: RRR; normal S1, S2, no murmur Lungs: CTA bilaterally Back: No CVAT Breast Exam: Normal to inspection, Normal breast tissue bilaterally and No masses or nodes.No dimpling, nipple retraction or discharge. Gastrointestinal: Soft, non-tender, no masses or organomegaly. Hemoccult negative Pelvic Exam: Vulva and vagina appear normal. Bimanual exam reveals normal uterus and adnexa. External genitalia: normal general appearance Urinary system: urethral meatus normal Vaginal: normal mucosa without prolapse or lesions and normal without tenderness, induration or masses Cervix: normal appearance.  Adnexa: normal bimanual exam Rectovaginal: not indicated Lymphatic Exam: Non-palpable nodes in neck, clavicular, axillary,  or inguinal regions Skin: no rash or abnormalities Neurologic: Normal gait and speech, no tremor  Psychiatric: Alert and oriented, appropriate affect.  Examination chaperoned by Alyssa Fitzpatrick   John Fitzpatrick. MD Pgr [phone] 11:47 AM

## 2013-08-25 LAB — CYTOLOGY - PAP

## 2013-09-15 ENCOUNTER — Telehealth: Payer: Self-pay

## 2013-09-15 NOTE — Telephone Encounter (Signed)
LVM for pt to call back and schedule A1c recheck with Dr. Everardo AllEllison.   Diabetic Bundle pt.

## 2013-10-21 ENCOUNTER — Other Ambulatory Visit: Payer: Self-pay | Admitting: Endocrinology

## 2013-10-22 NOTE — Telephone Encounter (Signed)
Please advise if ok to refill. Med is not on current medication list. Thanks!  

## 2014-01-12 ENCOUNTER — Other Ambulatory Visit: Payer: Self-pay | Admitting: Endocrinology

## 2014-01-12 NOTE — Telephone Encounter (Signed)
Please advise if ok to refill. Pt was last seen on 11/29/2012. Thanks!

## 2014-02-04 ENCOUNTER — Ambulatory Visit (INDEPENDENT_AMBULATORY_CARE_PROVIDER_SITE_OTHER): Payer: BC Managed Care – PPO | Admitting: Ophthalmology

## 2014-02-09 ENCOUNTER — Ambulatory Visit (INDEPENDENT_AMBULATORY_CARE_PROVIDER_SITE_OTHER): Payer: BC Managed Care – PPO | Admitting: Ophthalmology

## 2014-02-09 DIAGNOSIS — E11329 Type 2 diabetes mellitus with mild nonproliferative diabetic retinopathy without macular edema: Secondary | ICD-10-CM

## 2014-02-09 DIAGNOSIS — I10 Essential (primary) hypertension: Secondary | ICD-10-CM

## 2014-02-09 DIAGNOSIS — H338 Other retinal detachments: Secondary | ICD-10-CM

## 2014-02-09 DIAGNOSIS — E11319 Type 2 diabetes mellitus with unspecified diabetic retinopathy without macular edema: Secondary | ICD-10-CM

## 2014-02-09 DIAGNOSIS — H43813 Vitreous degeneration, bilateral: Secondary | ICD-10-CM

## 2014-02-09 DIAGNOSIS — H35033 Hypertensive retinopathy, bilateral: Secondary | ICD-10-CM

## 2014-08-10 ENCOUNTER — Telehealth: Payer: Self-pay | Admitting: *Deleted

## 2014-08-10 NOTE — Telephone Encounter (Signed)
Called patient about mammogram, she will call to make an appointment.

## 2014-08-12 ENCOUNTER — Ambulatory Visit (INDEPENDENT_AMBULATORY_CARE_PROVIDER_SITE_OTHER): Payer: 59 | Admitting: Ophthalmology

## 2014-08-12 DIAGNOSIS — H35033 Hypertensive retinopathy, bilateral: Secondary | ICD-10-CM | POA: Diagnosis not present

## 2014-08-12 DIAGNOSIS — E11339 Type 2 diabetes mellitus with moderate nonproliferative diabetic retinopathy without macular edema: Secondary | ICD-10-CM

## 2014-08-12 DIAGNOSIS — I10 Essential (primary) hypertension: Secondary | ICD-10-CM | POA: Diagnosis not present

## 2014-08-12 DIAGNOSIS — E11329 Type 2 diabetes mellitus with mild nonproliferative diabetic retinopathy without macular edema: Secondary | ICD-10-CM | POA: Diagnosis not present

## 2014-08-12 DIAGNOSIS — H43813 Vitreous degeneration, bilateral: Secondary | ICD-10-CM

## 2014-08-12 DIAGNOSIS — E11319 Type 2 diabetes mellitus with unspecified diabetic retinopathy without macular edema: Secondary | ICD-10-CM

## 2014-08-12 DIAGNOSIS — H338 Other retinal detachments: Secondary | ICD-10-CM

## 2015-03-08 ENCOUNTER — Ambulatory Visit (INDEPENDENT_AMBULATORY_CARE_PROVIDER_SITE_OTHER): Payer: 59 | Admitting: Ophthalmology

## 2015-04-08 ENCOUNTER — Ambulatory Visit (INDEPENDENT_AMBULATORY_CARE_PROVIDER_SITE_OTHER): Payer: BLUE CROSS/BLUE SHIELD | Admitting: Ophthalmology

## 2015-04-08 DIAGNOSIS — I10 Essential (primary) hypertension: Secondary | ICD-10-CM

## 2015-04-08 DIAGNOSIS — E11311 Type 2 diabetes mellitus with unspecified diabetic retinopathy with macular edema: Secondary | ICD-10-CM

## 2015-04-08 DIAGNOSIS — H43813 Vitreous degeneration, bilateral: Secondary | ICD-10-CM | POA: Diagnosis not present

## 2015-04-08 DIAGNOSIS — H35033 Hypertensive retinopathy, bilateral: Secondary | ICD-10-CM

## 2015-04-08 DIAGNOSIS — H338 Other retinal detachments: Secondary | ICD-10-CM

## 2015-04-08 DIAGNOSIS — E113292 Type 2 diabetes mellitus with mild nonproliferative diabetic retinopathy without macular edema, left eye: Secondary | ICD-10-CM | POA: Diagnosis not present

## 2015-04-08 DIAGNOSIS — E113311 Type 2 diabetes mellitus with moderate nonproliferative diabetic retinopathy with macular edema, right eye: Secondary | ICD-10-CM | POA: Diagnosis not present

## 2015-04-20 ENCOUNTER — Other Ambulatory Visit (HOSPITAL_COMMUNITY): Payer: Self-pay | Admitting: Internal Medicine

## 2015-04-20 DIAGNOSIS — R413 Other amnesia: Secondary | ICD-10-CM

## 2015-04-26 ENCOUNTER — Ambulatory Visit (HOSPITAL_COMMUNITY): Payer: BLUE CROSS/BLUE SHIELD

## 2015-04-29 ENCOUNTER — Ambulatory Visit (HOSPITAL_COMMUNITY)
Admission: RE | Admit: 2015-04-29 | Discharge: 2015-04-29 | Disposition: A | Discharge status: Home / Self Care | Payer: BLUE CROSS/BLUE SHIELD | Source: Ambulatory Visit | Attending: Internal Medicine | Admitting: Internal Medicine

## 2015-04-29 DIAGNOSIS — R413 Other amnesia: Secondary | ICD-10-CM | POA: Insufficient documentation

## 2015-07-12 ENCOUNTER — Encounter (INDEPENDENT_AMBULATORY_CARE_PROVIDER_SITE_OTHER): Payer: BLUE CROSS/BLUE SHIELD | Admitting: Ophthalmology

## 2015-07-12 DIAGNOSIS — H35033 Hypertensive retinopathy, bilateral: Secondary | ICD-10-CM

## 2015-07-12 DIAGNOSIS — I10 Essential (primary) hypertension: Secondary | ICD-10-CM

## 2015-07-12 DIAGNOSIS — H338 Other retinal detachments: Secondary | ICD-10-CM | POA: Diagnosis not present

## 2015-07-12 DIAGNOSIS — E113292 Type 2 diabetes mellitus with mild nonproliferative diabetic retinopathy without macular edema, left eye: Secondary | ICD-10-CM

## 2015-07-12 DIAGNOSIS — E11311 Type 2 diabetes mellitus with unspecified diabetic retinopathy with macular edema: Secondary | ICD-10-CM | POA: Diagnosis not present

## 2015-07-12 DIAGNOSIS — H43813 Vitreous degeneration, bilateral: Secondary | ICD-10-CM

## 2015-07-12 DIAGNOSIS — E113211 Type 2 diabetes mellitus with mild nonproliferative diabetic retinopathy with macular edema, right eye: Secondary | ICD-10-CM

## 2015-08-09 ENCOUNTER — Encounter (INDEPENDENT_AMBULATORY_CARE_PROVIDER_SITE_OTHER): Payer: BLUE CROSS/BLUE SHIELD | Admitting: Ophthalmology

## 2015-08-09 DIAGNOSIS — E113311 Type 2 diabetes mellitus with moderate nonproliferative diabetic retinopathy with macular edema, right eye: Secondary | ICD-10-CM

## 2015-08-09 DIAGNOSIS — E11311 Type 2 diabetes mellitus with unspecified diabetic retinopathy with macular edema: Secondary | ICD-10-CM

## 2015-08-09 DIAGNOSIS — H43813 Vitreous degeneration, bilateral: Secondary | ICD-10-CM

## 2015-08-09 DIAGNOSIS — H35033 Hypertensive retinopathy, bilateral: Secondary | ICD-10-CM

## 2015-08-09 DIAGNOSIS — I10 Essential (primary) hypertension: Secondary | ICD-10-CM

## 2015-08-09 DIAGNOSIS — H338 Other retinal detachments: Secondary | ICD-10-CM

## 2015-08-09 DIAGNOSIS — E113292 Type 2 diabetes mellitus with mild nonproliferative diabetic retinopathy without macular edema, left eye: Secondary | ICD-10-CM | POA: Diagnosis not present

## 2015-09-06 ENCOUNTER — Encounter (INDEPENDENT_AMBULATORY_CARE_PROVIDER_SITE_OTHER): Payer: BLUE CROSS/BLUE SHIELD | Admitting: Ophthalmology

## 2015-09-06 DIAGNOSIS — E10311 Type 1 diabetes mellitus with unspecified diabetic retinopathy with macular edema: Secondary | ICD-10-CM | POA: Diagnosis not present

## 2015-09-06 DIAGNOSIS — I10 Essential (primary) hypertension: Secondary | ICD-10-CM | POA: Diagnosis not present

## 2015-09-06 DIAGNOSIS — E103311 Type 1 diabetes mellitus with moderate nonproliferative diabetic retinopathy with macular edema, right eye: Secondary | ICD-10-CM

## 2015-09-06 DIAGNOSIS — H35033 Hypertensive retinopathy, bilateral: Secondary | ICD-10-CM | POA: Diagnosis not present

## 2015-09-06 DIAGNOSIS — H338 Other retinal detachments: Secondary | ICD-10-CM | POA: Diagnosis not present

## 2015-09-06 DIAGNOSIS — E103292 Type 1 diabetes mellitus with mild nonproliferative diabetic retinopathy without macular edema, left eye: Secondary | ICD-10-CM | POA: Diagnosis not present

## 2015-09-06 DIAGNOSIS — H43813 Vitreous degeneration, bilateral: Secondary | ICD-10-CM

## 2015-10-04 ENCOUNTER — Encounter (INDEPENDENT_AMBULATORY_CARE_PROVIDER_SITE_OTHER): Payer: BLUE CROSS/BLUE SHIELD | Admitting: Ophthalmology

## 2015-10-04 DIAGNOSIS — H338 Other retinal detachments: Secondary | ICD-10-CM

## 2015-10-04 DIAGNOSIS — E113392 Type 2 diabetes mellitus with moderate nonproliferative diabetic retinopathy without macular edema, left eye: Secondary | ICD-10-CM | POA: Diagnosis not present

## 2015-10-04 DIAGNOSIS — H43813 Vitreous degeneration, bilateral: Secondary | ICD-10-CM

## 2015-10-04 DIAGNOSIS — E113311 Type 2 diabetes mellitus with moderate nonproliferative diabetic retinopathy with macular edema, right eye: Secondary | ICD-10-CM | POA: Diagnosis not present

## 2015-10-04 DIAGNOSIS — I10 Essential (primary) hypertension: Secondary | ICD-10-CM | POA: Diagnosis not present

## 2015-10-04 DIAGNOSIS — H35033 Hypertensive retinopathy, bilateral: Secondary | ICD-10-CM

## 2015-10-04 DIAGNOSIS — E11311 Type 2 diabetes mellitus with unspecified diabetic retinopathy with macular edema: Secondary | ICD-10-CM | POA: Diagnosis not present

## 2015-11-15 ENCOUNTER — Encounter (INDEPENDENT_AMBULATORY_CARE_PROVIDER_SITE_OTHER): Payer: BLUE CROSS/BLUE SHIELD | Admitting: Ophthalmology

## 2015-11-15 DIAGNOSIS — H338 Other retinal detachments: Secondary | ICD-10-CM

## 2015-11-15 DIAGNOSIS — E11311 Type 2 diabetes mellitus with unspecified diabetic retinopathy with macular edema: Secondary | ICD-10-CM

## 2015-11-15 DIAGNOSIS — E103292 Type 1 diabetes mellitus with mild nonproliferative diabetic retinopathy without macular edema, left eye: Secondary | ICD-10-CM | POA: Diagnosis not present

## 2015-11-15 DIAGNOSIS — I10 Essential (primary) hypertension: Secondary | ICD-10-CM | POA: Diagnosis not present

## 2015-11-15 DIAGNOSIS — H43813 Vitreous degeneration, bilateral: Secondary | ICD-10-CM

## 2015-11-15 DIAGNOSIS — E103311 Type 1 diabetes mellitus with moderate nonproliferative diabetic retinopathy with macular edema, right eye: Secondary | ICD-10-CM

## 2015-11-15 DIAGNOSIS — H35033 Hypertensive retinopathy, bilateral: Secondary | ICD-10-CM | POA: Diagnosis not present

## 2015-12-14 ENCOUNTER — Encounter (INDEPENDENT_AMBULATORY_CARE_PROVIDER_SITE_OTHER): Payer: Self-pay | Admitting: Internal Medicine

## 2015-12-28 ENCOUNTER — Ambulatory Visit (INDEPENDENT_AMBULATORY_CARE_PROVIDER_SITE_OTHER): Payer: BLUE CROSS/BLUE SHIELD | Admitting: Internal Medicine

## 2015-12-28 ENCOUNTER — Encounter (INDEPENDENT_AMBULATORY_CARE_PROVIDER_SITE_OTHER): Payer: Self-pay

## 2015-12-28 ENCOUNTER — Encounter (INDEPENDENT_AMBULATORY_CARE_PROVIDER_SITE_OTHER): Payer: Self-pay | Admitting: Internal Medicine

## 2015-12-28 ENCOUNTER — Encounter (INDEPENDENT_AMBULATORY_CARE_PROVIDER_SITE_OTHER): Payer: Self-pay | Admitting: *Deleted

## 2015-12-28 VITALS — BP 110/60 | HR 72 | Temp 98.1°F | Ht 65.0 in | Wt 201.1 lb

## 2015-12-28 DIAGNOSIS — K3184 Gastroparesis: Secondary | ICD-10-CM

## 2015-12-28 DIAGNOSIS — R112 Nausea with vomiting, unspecified: Secondary | ICD-10-CM

## 2015-12-28 NOTE — Patient Instructions (Signed)
NM Gastric emptying study.  

## 2015-12-28 NOTE — Progress Notes (Signed)
Subjective:    Patient ID: Alyssa Fitzpatrick, female    DOB: 1955-04-20, 60 y.o.   MRN: [id number]  HPI Referred by Dr. Ouida SillsFagan for nausea, vomiting after every meal, mucous in stools. She tells me she had nausea 3 weeks ago.  Symptoms lasted 2 days. She has had similar episodes in the past. She says she has nausea frequently. She says what she vomited had mucousy and looked like the food she had ate before. She has had nausea for about 6 months.  When she vomited, she had epigastric pain. Her appetite is good. No weight loss.  She has a BM when she takes a stool softener. She has a BM every 2-3 days if she doesn't use a stool softener. She says her stools fall apart when she flushes the commode.  Sometimes her stools are past. Really no change in her stools.  Her last EGD and colonoscopy was in 2004 by DR. Sheryn Bisonavid Patterson.(constipation, abdominal pain/bloating) Colonoscopy: Normal exam to cecum to rectum. Diverticulosis descending colon to sigmoid colon. No bleeding.  EGD: early satiety, reflux symptoms;  Normal proximal esophagus to duodenal 2nd portion.  Diabetic x 30 yrs.  She eat 2 meals a day. NM Gastric Emptying study in 2000 abnormal and was on Domperidone but could not afford     Review of Systems Past Medical History:  Diagnosis Date  . ANXIETY 02/08/2010  . ASYMPTOMATIC POSTMENOPAUSAL STATUS 07/05/2007  . Diabetes (HCC)   . DM 07/05/2007  . Gastroparesis 08/20/2007  . GERD 07/30/2006  . Hyperlipidemia   . HYPERTENSION 11/18/2007  . HYPERTHYROIDISM 11/16/2009  . MYOFASCIAL PAIN SYNDROME 01/08/2007  . NEPHROPATHY, DIABETIC 07/05/2007  . OSTEOPOROSIS 07/30/2006  . Pernicious anemia 07/30/2006  . Sinus trouble     Past Surgical History:  Procedure Laterality Date  . CARPAL TUNNEL RELEASE  2010   Bilaterally  . CHOLECYSTECTOMY    . GROWTH PLATE SURGERY     left leg  . nerve shealth release  2010   both elbows  . RETINAL DETACHMENT SURGERY  2010  . TRIGGER FINGER RELEASE  2010  .  TUBAL LIGATION      No Known Allergies  Current Outpatient Prescriptions on File Prior to Visit  Medication Sig Dispense Refill  . aspirin 81 MG tablet Take 81 mg by mouth daily.      . insulin glargine (LANTUS) 100 UNIT/ML injection Inject 70 Units into the skin at bedtime.     Marland Kitchen. levothyroxine (SYNTHROID, LEVOTHROID) 50 MCG tablet Take 75 mcg by mouth daily before breakfast.    . omeprazole (PRILOSEC) 20 MG capsule TAKE 2 CAPSULES DAILY. 60 capsule 2  . Venlafaxine HCl 75 MG TB24 Take 150 mg by mouth daily.     No current facility-administered medications on file prior to visit.        Objective:   Physical Exam Blood pressure 110/60, pulse 72, temperature 98.1 F (36.7 C), height 5\' 5"  (1.651 m), weight 201 lb 1.6 oz (91.2 kg). Alert and oriented. Skin warm and dry. Oral mucosa is moist.   . Sclera anicteric, conjunctivae is pink. Thyroid not enlarged. No cervical lymphadenopathy. Lungs clear. Heart regular rate and rhythm.  Abdomen is soft. Bowel sounds are positive. No hepatomegaly. No abdominal masses felt. No tenderness.  No edema to lower extremities.          Assessment & Plan:  Nausea. Am going to get a gastric emptying study.  She will need a colonoscopy in the near future.  Gastroparesis diet given to patient.

## 2015-12-31 ENCOUNTER — Encounter (INDEPENDENT_AMBULATORY_CARE_PROVIDER_SITE_OTHER): Payer: Self-pay

## 2016-01-05 ENCOUNTER — Encounter (HOSPITAL_COMMUNITY): Payer: Self-pay

## 2016-01-05 ENCOUNTER — Encounter (HOSPITAL_COMMUNITY)
Admission: RE | Admit: 2016-01-05 | Discharge: 2016-01-05 | Disposition: A | Discharge status: Home / Self Care | Payer: BLUE CROSS/BLUE SHIELD | Source: Ambulatory Visit | Attending: Internal Medicine | Admitting: Internal Medicine

## 2016-01-05 DIAGNOSIS — R112 Nausea with vomiting, unspecified: Secondary | ICD-10-CM | POA: Diagnosis present

## 2016-01-05 MED ORDER — TECHNETIUM TC 99M SULFUR COLLOID
2.0000 | Freq: Once | INTRAVENOUS | Status: AC | PRN
  Administered 2016-01-05: 2 via INTRAVENOUS

## 2016-01-11 ENCOUNTER — Telehealth (INDEPENDENT_AMBULATORY_CARE_PROVIDER_SITE_OTHER): Payer: Self-pay | Admitting: Internal Medicine

## 2016-01-11 NOTE — Telephone Encounter (Signed)
Patient called returning Terri's call.  [phone]

## 2016-01-12 ENCOUNTER — Telehealth (INDEPENDENT_AMBULATORY_CARE_PROVIDER_SITE_OTHER): Payer: Self-pay | Admitting: Internal Medicine

## 2016-01-12 NOTE — Telephone Encounter (Signed)
She will have OV in 4 weeks (corrected)

## 2016-01-12 NOTE — Telephone Encounter (Signed)
Needs OV in 4 weeks. 

## 2016-01-12 NOTE — Telephone Encounter (Signed)
Results given to patient. She feels better. (50%).  Am going to bring her back in 8 weeks.

## 2016-01-17 ENCOUNTER — Encounter (INDEPENDENT_AMBULATORY_CARE_PROVIDER_SITE_OTHER): Payer: BLUE CROSS/BLUE SHIELD | Admitting: Ophthalmology

## 2016-01-17 DIAGNOSIS — H35033 Hypertensive retinopathy, bilateral: Secondary | ICD-10-CM | POA: Diagnosis not present

## 2016-01-17 DIAGNOSIS — E11311 Type 2 diabetes mellitus with unspecified diabetic retinopathy with macular edema: Secondary | ICD-10-CM

## 2016-01-17 DIAGNOSIS — I10 Essential (primary) hypertension: Secondary | ICD-10-CM

## 2016-01-17 DIAGNOSIS — H338 Other retinal detachments: Secondary | ICD-10-CM

## 2016-01-17 DIAGNOSIS — E103311 Type 1 diabetes mellitus with moderate nonproliferative diabetic retinopathy with macular edema, right eye: Secondary | ICD-10-CM | POA: Diagnosis not present

## 2016-01-17 DIAGNOSIS — H43813 Vitreous degeneration, bilateral: Secondary | ICD-10-CM

## 2016-01-17 DIAGNOSIS — E103392 Type 1 diabetes mellitus with moderate nonproliferative diabetic retinopathy without macular edema, left eye: Secondary | ICD-10-CM | POA: Diagnosis not present

## 2016-01-19 ENCOUNTER — Encounter (INDEPENDENT_AMBULATORY_CARE_PROVIDER_SITE_OTHER): Payer: Self-pay | Admitting: Internal Medicine

## 2016-01-19 NOTE — Telephone Encounter (Signed)
Patient was given an appointment for 02/09/16 at 11:30am with Dorene Arerri Setzer, NP.  A letter was mailed to the patient.

## 2016-02-09 ENCOUNTER — Ambulatory Visit (INDEPENDENT_AMBULATORY_CARE_PROVIDER_SITE_OTHER): Payer: BLUE CROSS/BLUE SHIELD | Admitting: Internal Medicine

## 2016-02-09 ENCOUNTER — Encounter (INDEPENDENT_AMBULATORY_CARE_PROVIDER_SITE_OTHER): Payer: Self-pay | Admitting: Internal Medicine

## 2016-02-09 VITALS — BP 120/80 | HR 66 | Temp 97.6°F | Ht 64.0 in | Wt 204.7 lb

## 2016-02-09 DIAGNOSIS — R110 Nausea: Secondary | ICD-10-CM

## 2016-02-09 NOTE — Patient Instructions (Addendum)
Gastr paresis diet given to patient.  OV in 6 months.

## 2016-02-09 NOTE — Progress Notes (Signed)
Subjective:    Patient ID: Alyssa Fitzpatrick, female    DOB: [birth year], 60 y.o.   MRN: [id number]  HPI Here today for f/u for nausea. Hx of diabetes. Underwent a Gastric emptying study in November which was normal. After her emptying study she was better. She had surgery on her rt arm in November and she had vomiting 2 days after surgery. She says sometimes she has a gnawing in her epigastric region.  Yesterday, she says she saw some blood with her BM. She says her stools was formed but broke up after she flushed the commode.   She says she was not constipated.  She is not having acid reflux as long as she takes the Prilosec. She keeps the head of her bed elevated.   Her last EGD and colonoscopy was in 2004 by DR. Sheryn Bisonavid Patterson.(constipation, abdominal pain/bloating) Colonoscopy: Normal exam to cecum to rectum. Diverticulosis descending colon to sigmoid colon. No bleeding.  EGD: early satiety, reflux symptoms;  Normal proximal esophagus to duodenal 2nd portion.  Diabetic x 30 yrs.   HA1C 6.9 in November.  Review of Systems Past Medical History:  Diagnosis Date  . ANXIETY 02/08/2010  . ASYMPTOMATIC POSTMENOPAUSAL STATUS 07/05/2007  . Diabetes (HCC)   . DM 07/05/2007  . Gastroparesis 08/20/2007  . GERD 07/30/2006  . Hyperlipidemia   . HYPERTENSION 11/18/2007  . HYPERTHYROIDISM 11/16/2009  . MYOFASCIAL PAIN SYNDROME 01/08/2007  . NEPHROPATHY, DIABETIC 07/05/2007  . OSTEOPOROSIS 07/30/2006  . Pernicious anemia 07/30/2006  . Sinus trouble     Past Surgical History:  Procedure Laterality Date  . CARPAL TUNNEL RELEASE  2010   Bilaterally  . CHOLECYSTECTOMY    . GROWTH PLATE SURGERY     left leg  . nerve shealth release  2010   both elbows  . RETINAL DETACHMENT SURGERY  2010  . TRIGGER FINGER RELEASE  2010  . TUBAL LIGATION      No Known Allergies  Current Outpatient Prescriptions on File Prior to Visit  Medication Sig Dispense Refill  . Ascorbic Acid (VITAMIN C) 1000 MG tablet Take  1,000 mg by mouth daily.    Marland Kitchen. aspirin 81 MG tablet Take 81 mg by mouth daily.      . canagliflozin (INVOKANA) 300 MG TABS tablet Take 300 mg by mouth daily before breakfast.    . docusate sodium (COLACE) 100 MG capsule Take 100 mg by mouth 2 (two) times daily.    Marland Kitchen. donepezil (ARICEPT) 10 MG tablet Take 10 mg by mouth at bedtime.    . Insulin Aspart (NOVOLOG FLEXPEN Amber) Inject into the skin. Inject 20 units at breakfast, 15 units at lunc and 60 units at supper.    . insulin glargine (LANTUS) 100 UNIT/ML injection Inject 70 Units into the skin at bedtime.     Marland Kitchen. levothyroxine (SYNTHROID, LEVOTHROID) 50 MCG tablet Take 75 mcg by mouth daily before breakfast.    . losartan (COZAAR) 50 MG tablet Take 50 mg by mouth daily.    . metFORMIN (GLUCOPHAGE) 1000 MG tablet Take 1,000 mg by mouth 2 (two) times daily with a meal.    . omeprazole (PRILOSEC) 20 MG capsule TAKE 2 CAPSULES DAILY. 60 capsule 2  . pravastatin (PRAVACHOL) 40 MG tablet Take 40 mg by mouth daily.    . Venlafaxine HCl 75 MG TB24 Take 150 mg by mouth daily.    . vitamin B-12 (CYANOCOBALAMIN) 100 MCG tablet Take 100 mcg by mouth daily.     No current  facility-administered medications on file prior to visit.        Objective:   Physical Exam Blood pressure 120/80, pulse 66, temperature 97.6 F (36.4 C), height 5\' 4"  (1.626 m), weight 204 lb 11.2 oz (92.9 kg). Alert and oriented. Skin warm and dry. Oral mucosa is moist.   . Sclera anicteric, conjunctivae is pink. Thyroid not enlarged. No cervical lymphadenopathy. Lungs clear. Heart regular rate and rhythm.  Abdomen is soft. Bowel sounds are positive. No hepatomegaly. No abdominal masses felt. No tenderness.  No edema to lower extremities.          Assessment & Plan:  Nausea/Vomiting. Resolved.  Rectal bleeding x 1. Her last colonoscopy was in 2004. She is due for a colonoscopy. Colonic neoplasm, AVM, ulcer needs to be ruled out.  She would like to wait before proceeding with a  colonoscopy. She will let me know when. GERD controlled with Prilosec

## 2016-04-10 ENCOUNTER — Encounter (INDEPENDENT_AMBULATORY_CARE_PROVIDER_SITE_OTHER): Payer: BLUE CROSS/BLUE SHIELD | Admitting: Ophthalmology

## 2016-04-10 DIAGNOSIS — E10311 Type 1 diabetes mellitus with unspecified diabetic retinopathy with macular edema: Secondary | ICD-10-CM | POA: Diagnosis not present

## 2016-04-10 DIAGNOSIS — H35033 Hypertensive retinopathy, bilateral: Secondary | ICD-10-CM

## 2016-04-10 DIAGNOSIS — E103392 Type 1 diabetes mellitus with moderate nonproliferative diabetic retinopathy without macular edema, left eye: Secondary | ICD-10-CM

## 2016-04-10 DIAGNOSIS — H43813 Vitreous degeneration, bilateral: Secondary | ICD-10-CM | POA: Diagnosis not present

## 2016-04-10 DIAGNOSIS — H338 Other retinal detachments: Secondary | ICD-10-CM | POA: Diagnosis not present

## 2016-04-10 DIAGNOSIS — I10 Essential (primary) hypertension: Secondary | ICD-10-CM

## 2016-04-10 DIAGNOSIS — E103311 Type 1 diabetes mellitus with moderate nonproliferative diabetic retinopathy with macular edema, right eye: Secondary | ICD-10-CM

## 2016-06-19 ENCOUNTER — Encounter (INDEPENDENT_AMBULATORY_CARE_PROVIDER_SITE_OTHER): Payer: BLUE CROSS/BLUE SHIELD | Admitting: Ophthalmology

## 2016-06-19 DIAGNOSIS — I10 Essential (primary) hypertension: Secondary | ICD-10-CM | POA: Diagnosis not present

## 2016-06-19 DIAGNOSIS — H338 Other retinal detachments: Secondary | ICD-10-CM

## 2016-06-19 DIAGNOSIS — E103311 Type 1 diabetes mellitus with moderate nonproliferative diabetic retinopathy with macular edema, right eye: Secondary | ICD-10-CM

## 2016-06-19 DIAGNOSIS — H43813 Vitreous degeneration, bilateral: Secondary | ICD-10-CM

## 2016-06-19 DIAGNOSIS — H35033 Hypertensive retinopathy, bilateral: Secondary | ICD-10-CM | POA: Diagnosis not present

## 2016-06-19 DIAGNOSIS — E11311 Type 2 diabetes mellitus with unspecified diabetic retinopathy with macular edema: Secondary | ICD-10-CM

## 2016-06-19 DIAGNOSIS — E103292 Type 1 diabetes mellitus with mild nonproliferative diabetic retinopathy without macular edema, left eye: Secondary | ICD-10-CM | POA: Diagnosis not present

## 2016-08-09 ENCOUNTER — Encounter (INDEPENDENT_AMBULATORY_CARE_PROVIDER_SITE_OTHER): Payer: Self-pay | Admitting: Internal Medicine

## 2016-08-09 ENCOUNTER — Ambulatory Visit (INDEPENDENT_AMBULATORY_CARE_PROVIDER_SITE_OTHER): Payer: BLUE CROSS/BLUE SHIELD | Admitting: Internal Medicine

## 2016-08-09 ENCOUNTER — Encounter (INDEPENDENT_AMBULATORY_CARE_PROVIDER_SITE_OTHER): Payer: Self-pay

## 2016-08-09 VITALS — BP 122/70 | HR 60 | Temp 98.5°F | Ht 64.0 in | Wt 201.0 lb

## 2016-08-09 DIAGNOSIS — R112 Nausea with vomiting, unspecified: Secondary | ICD-10-CM

## 2016-08-09 DIAGNOSIS — K3184 Gastroparesis: Secondary | ICD-10-CM

## 2016-08-09 LAB — HEPATIC FUNCTION PANEL
ALT: 35 U/L — ABNORMAL HIGH (ref 6–29)
AST: 25 U/L (ref 10–35)
Albumin: 4.4 g/dL (ref 3.6–5.1)
Alkaline Phosphatase: 81 U/L (ref 33–130)
Bilirubin, Direct: 0.1 mg/dL (ref <=0.2)
Indirect Bilirubin: 0.3 mg/dL (ref 0.2–1.2)
Total Bilirubin: 0.4 mg/dL (ref 0.2–1.2)
Total Protein: 7.2 g/dL (ref 6.1–8.1)

## 2016-08-09 NOTE — Patient Instructions (Signed)
Hepatic function

## 2016-08-09 NOTE — Progress Notes (Addendum)
Subjective:    Patient ID: Alyssa Fitzpatrick, female    DOB: [birth year], 61 y.o.   MRN: [id number]  HPI  Here today for six month follow/   Hx of diabetes. Gastric emptying study in November 2017 was normal.  Last colonoscopy in 2004. She stated last visit she would let me know when she is ready to proceed with the procedure. She tells me has some nausea.Last Sunday she had nausea. There was no fever.  If she get hot, she becomes nauseated. This am she had nausea. She is eating bananas, yogurt when she is nauseated.   She is eating 3 meals a day.  She can eat pretzel and keep them down.  HA1C  06/19/2016  And was 6 Has nausea off and on x 2 yrs.  Her nausea is sporadic.   Her last EGD and colonoscopy was in 2004 by Dr. Sheryn Bison.(constipation, abdominal pain/bloating) Colonoscopy: Normal exam to cecum to rectum. Diverticulosis descending colon to sigmoid colon. No bleeding.  EGD: early satiety, reflux symptoms; Normal proximal esophagus to duodenal 2nd portion.  Diabetic x 30 yrs.  Review of Systems    Past Medical History:  Diagnosis Date  . ANXIETY 02/08/2010  . ASYMPTOMATIC POSTMENOPAUSAL STATUS 07/05/2007  . Diabetes (HCC)   . DM 07/05/2007  . Gastroparesis 08/20/2007  . GERD 07/30/2006  . Hyperlipidemia   . HYPERTENSION 11/18/2007  . HYPERTHYROIDISM 11/16/2009  . MYOFASCIAL PAIN SYNDROME 01/08/2007  . NEPHROPATHY, DIABETIC 07/05/2007  . OSTEOPOROSIS 07/30/2006  . Pernicious anemia 07/30/2006  . Sinus trouble     Past Surgical History:  Procedure Laterality Date  . CARPAL TUNNEL RELEASE  2010   Bilaterally  . CHOLECYSTECTOMY    . GROWTH PLATE SURGERY     left leg  . nerve shealth release  2010   both elbows  . RETINAL DETACHMENT SURGERY  2010  . TRIGGER FINGER RELEASE  2010  . TUBAL LIGATION      No Known Allergies  Current Outpatient Prescriptions on File Prior to Visit  Medication Sig Dispense Refill  . Ascorbic Acid (VITAMIN C) 1000 MG tablet Take 1,000 mg by  mouth daily.    Marland Kitchen aspirin 81 MG tablet Take 81 mg by mouth daily.      . canagliflozin (INVOKANA) 300 MG TABS tablet Take 300 mg by mouth daily before breakfast.    . docusate sodium (COLACE) 100 MG capsule Take 100 mg by mouth 2 (two) times daily.    Marland Kitchen donepezil (ARICEPT) 10 MG tablet Take 10 mg by mouth at bedtime.    . Insulin Aspart (NOVOLOG FLEXPEN Luke) Inject into the skin. Inject 10 units at breakfast, 10 units at lunc and 20 units at supper.    . insulin glargine (LANTUS) 100 UNIT/ML injection Inject 50 Units into the skin at bedtime.     Marland Kitchen levothyroxine (SYNTHROID, LEVOTHROID) 50 MCG tablet Take 75 mcg by mouth daily before breakfast.    . losartan (COZAAR) 50 MG tablet Take 50 mg by mouth daily.    . metFORMIN (GLUCOPHAGE) 1000 MG tablet Take 1,000 mg by mouth 2 (two) times daily with a meal.    . omeprazole (PRILOSEC) 20 MG capsule TAKE 2 CAPSULES DAILY. 60 capsule 2  . Venlafaxine HCl 75 MG TB24 Take 150 mg by mouth daily.    . vitamin B-12 (CYANOCOBALAMIN) 100 MCG tablet Take 100 mcg by mouth daily.    . pravastatin (PRAVACHOL) 40 MG tablet Take 40 mg by mouth daily.  No current facility-administered medications on file prior to visit.         Objective:   Physical Exam Blood pressure 122/70, pulse 60, temperature 98.5 F (36.9 C), height 5\' 4"  (1.626 m), weight 201 lb (91.2 kg).  Alert and oriented. Skin warm and dry. Oral mucosa is moist.   . Sclera anicteric, conjunctivae is pink. Thyroid not enlarged. No cervical lymphadenopathy. Lungs clear. Heart regular rate and rhythm.  Abdomen is soft. Bowel sounds are positive. No hepatomegaly. No abdominal masses felt. No tenderness.  No edema to lower extremities.         Assessment & Plan:  Hx of gastroparesis.  Follow gastroparesis diet. Chronic nausea: Hepatic function.

## 2016-09-11 ENCOUNTER — Encounter (INDEPENDENT_AMBULATORY_CARE_PROVIDER_SITE_OTHER): Payer: BLUE CROSS/BLUE SHIELD | Admitting: Ophthalmology

## 2016-09-11 DIAGNOSIS — E113292 Type 2 diabetes mellitus with mild nonproliferative diabetic retinopathy without macular edema, left eye: Secondary | ICD-10-CM

## 2016-09-11 DIAGNOSIS — H35033 Hypertensive retinopathy, bilateral: Secondary | ICD-10-CM | POA: Diagnosis not present

## 2016-09-11 DIAGNOSIS — H43813 Vitreous degeneration, bilateral: Secondary | ICD-10-CM

## 2016-09-11 DIAGNOSIS — E11311 Type 2 diabetes mellitus with unspecified diabetic retinopathy with macular edema: Secondary | ICD-10-CM

## 2016-09-11 DIAGNOSIS — I10 Essential (primary) hypertension: Secondary | ICD-10-CM

## 2016-09-11 DIAGNOSIS — E113311 Type 2 diabetes mellitus with moderate nonproliferative diabetic retinopathy with macular edema, right eye: Secondary | ICD-10-CM | POA: Diagnosis not present

## 2016-09-11 DIAGNOSIS — H338 Other retinal detachments: Secondary | ICD-10-CM

## 2016-10-28 DEATH — deceased

## 2016-11-30 IMAGING — NM NM GASTRIC EMPTYING
8 series · 8 of 8 positions shown · non-contrast
Comparison: None.

CLINICAL DATA: Nausea and vomiting

EXAM:
NUCLEAR MEDICINE GASTRIC EMPTYING SCAN
TECHNIQUE: After oral ingestion of radiolabeled meal, sequential abdominal
images were obtained for 3 hours. Percentage of activity emptying
the stomach was calculated at 1 hour, 2 hour, and 3 hours.
RADIOPHARMACEUTICALS:  Two mCi Bc-OOm sulfur colloid in standardized
meal

[Series 1: 0 min · 4.14mm/px · 1 of 1 slices shown (1 of 2)]
[im 1/1]
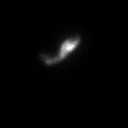

[Series 1: 0 min · 4.14mm/px · 1 of 1 slices shown (2 of 2)]
[im 1/1]
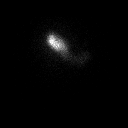

[Series 2: 60 min · 4.14mm/px · 1 of 1 slices shown (1 of 2)]
[im 1/1]
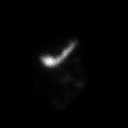

[Series 2: 60 min · 4.14mm/px · 1 of 1 slices shown (2 of 2)]
[im 1/1]
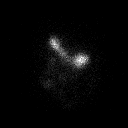

[Series 3: 120 min · 4.14mm/px · 1 of 1 slices shown (1 of 2)]
[im 1/1]
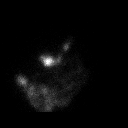

[Series 3: 120 min · 4.14mm/px · 1 of 1 slices shown (2 of 2)]
[im 1/1]
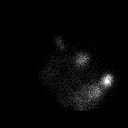

[Series 4: 180 min · 4.14mm/px · 1 of 1 slices shown (1 of 2)]
[im 1/1]
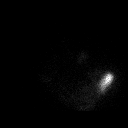

[Series 4: 180 min · 4.14mm/px · 1 of 1 slices shown (2 of 2)]
[im 1/1]
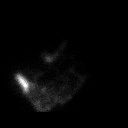

[8 of 8 positions shown; findings below may reference images not displayed]

FINDINGS: Expected location of the stomach in the left upper quadrant.
Ingested meal empties the stomach gradually over the course of the
study.

12.5% emptied at 1 hr ( normal >= 10%)

68.4% emptied at 2 hr ( normal >= 40%)

90.3% emptied at 3 hr ( normal >= 70%)
IMPRESSION: Normal gastric emptying study.

## 2017-01-01 ENCOUNTER — Encounter (INDEPENDENT_AMBULATORY_CARE_PROVIDER_SITE_OTHER): Payer: BLUE CROSS/BLUE SHIELD | Admitting: Ophthalmology

## 2017-02-08 ENCOUNTER — Ambulatory Visit (INDEPENDENT_AMBULATORY_CARE_PROVIDER_SITE_OTHER): Payer: BLUE CROSS/BLUE SHIELD | Admitting: Internal Medicine
